# Patient Record
Sex: Female | Born: 1970
Health system: Southern US, Community
[De-identification: ages and names within clinical notes are randomized; demographics above are authoritative.]

## PROBLEM LIST (undated history)

## (undated) DIAGNOSIS — M1990 Unspecified osteoarthritis, unspecified site: Secondary | ICD-10-CM

## (undated) DIAGNOSIS — K219 Gastro-esophageal reflux disease without esophagitis: Secondary | ICD-10-CM

## (undated) DIAGNOSIS — I10 Essential (primary) hypertension: Secondary | ICD-10-CM

## (undated) DIAGNOSIS — E1169 Type 2 diabetes mellitus with other specified complication: Secondary | ICD-10-CM

## (undated) DIAGNOSIS — F419 Anxiety disorder, unspecified: Secondary | ICD-10-CM

## (undated) DIAGNOSIS — E785 Hyperlipidemia, unspecified: Secondary | ICD-10-CM

## (undated) HISTORY — DX: Gastro-esophageal reflux disease without esophagitis: K21.9

## (undated) HISTORY — PX: KNEE ARTHROSCOPY: SUR90

## (undated) HISTORY — DX: Hyperlipidemia, unspecified: E78.5

## (undated) HISTORY — PX: ABDOMINAL HYSTERECTOMY: SHX81

## (undated) HISTORY — PX: APPENDECTOMY: SHX54

## (undated) HISTORY — DX: Essential (primary) hypertension: I10

## (undated) HISTORY — PX: INCONTINENCE SURGERY: SHX676

---

## 2003-05-08 ENCOUNTER — Other Ambulatory Visit: Payer: Self-pay

## 2008-11-23 ENCOUNTER — Ambulatory Visit: Payer: Self-pay | Admitting: Internal Medicine

## 2008-11-27 ENCOUNTER — Ambulatory Visit: Payer: Self-pay | Admitting: Internal Medicine

## 2008-12-04 ENCOUNTER — Ambulatory Visit: Payer: Self-pay | Admitting: Internal Medicine

## 2009-01-31 ENCOUNTER — Ambulatory Visit: Payer: Self-pay | Admitting: Unknown Physician Specialty

## 2009-02-02 ENCOUNTER — Ambulatory Visit: Payer: Self-pay | Admitting: Unknown Physician Specialty

## 2009-10-01 ENCOUNTER — Emergency Department: Payer: Self-pay | Admitting: Emergency Medicine

## 2009-12-24 ENCOUNTER — Ambulatory Visit: Payer: Self-pay | Admitting: Internal Medicine

## 2010-11-29 IMAGING — CR DG KNEE COMPLETE 4+V*L*
1 series · 4 of 4 positions shown · non-contrast
Comparison: none

REASON FOR EXAM: Left Knee Pain
COMMENTS:

[Series 1: view not recorded · 0.17mm/px · 4 of 4 slices shown]
[im 1/4]
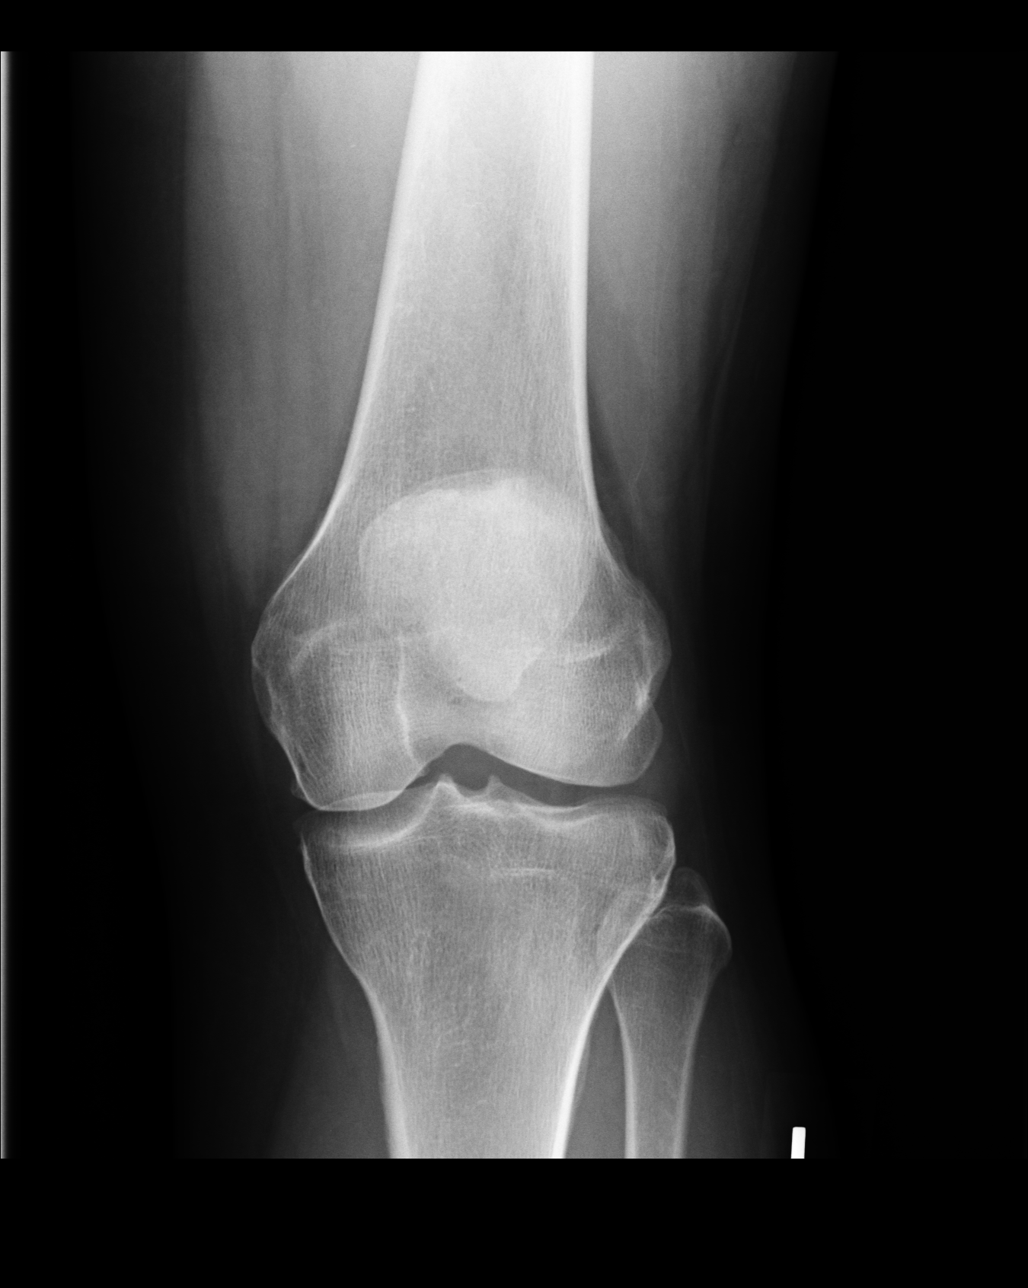
[im 2/4]
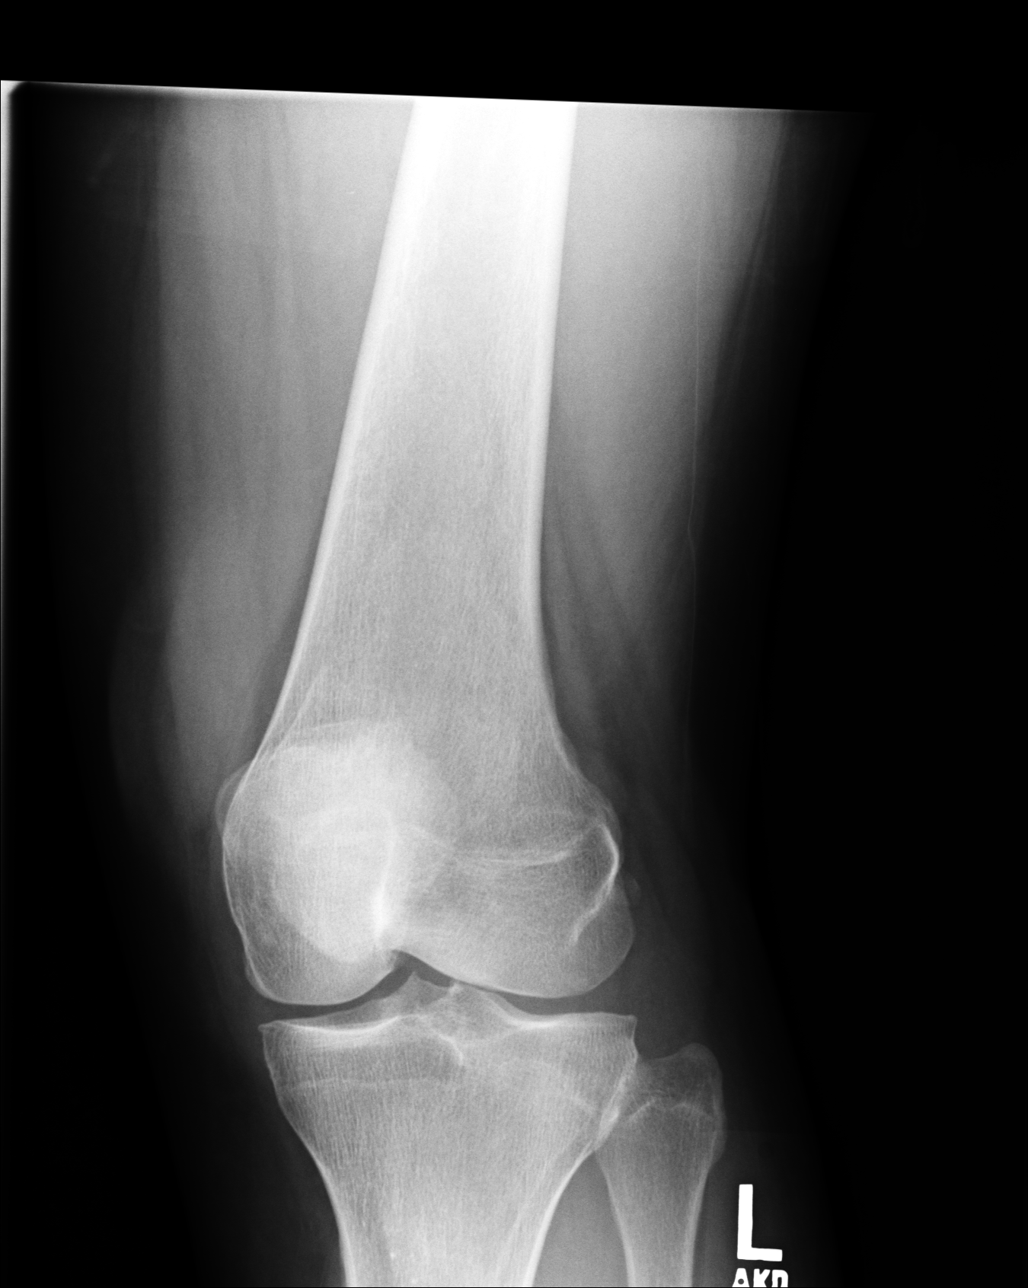
[im 3/4]
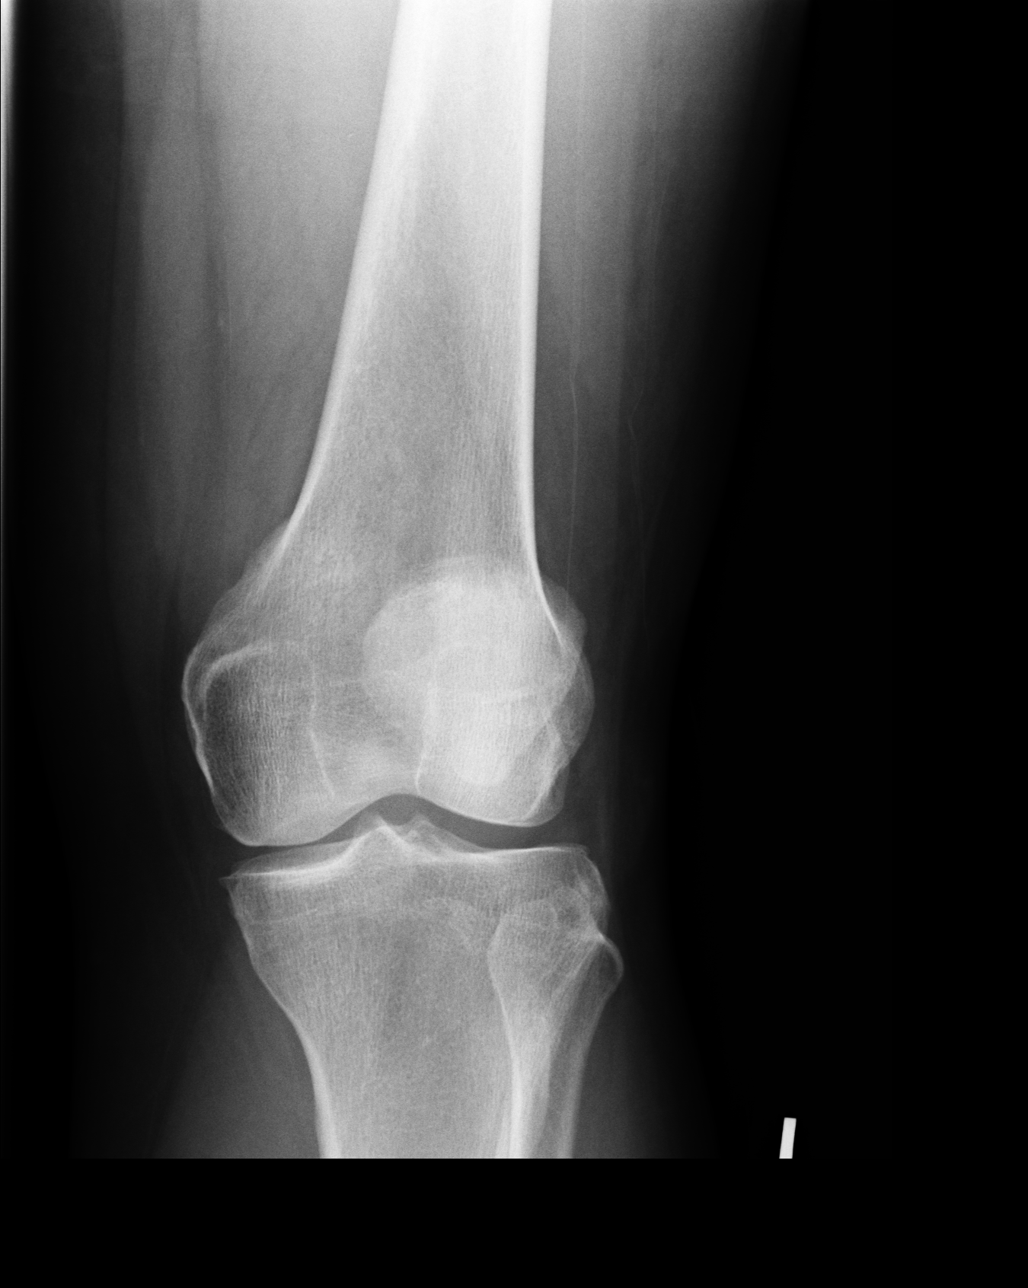
[im 4/4]
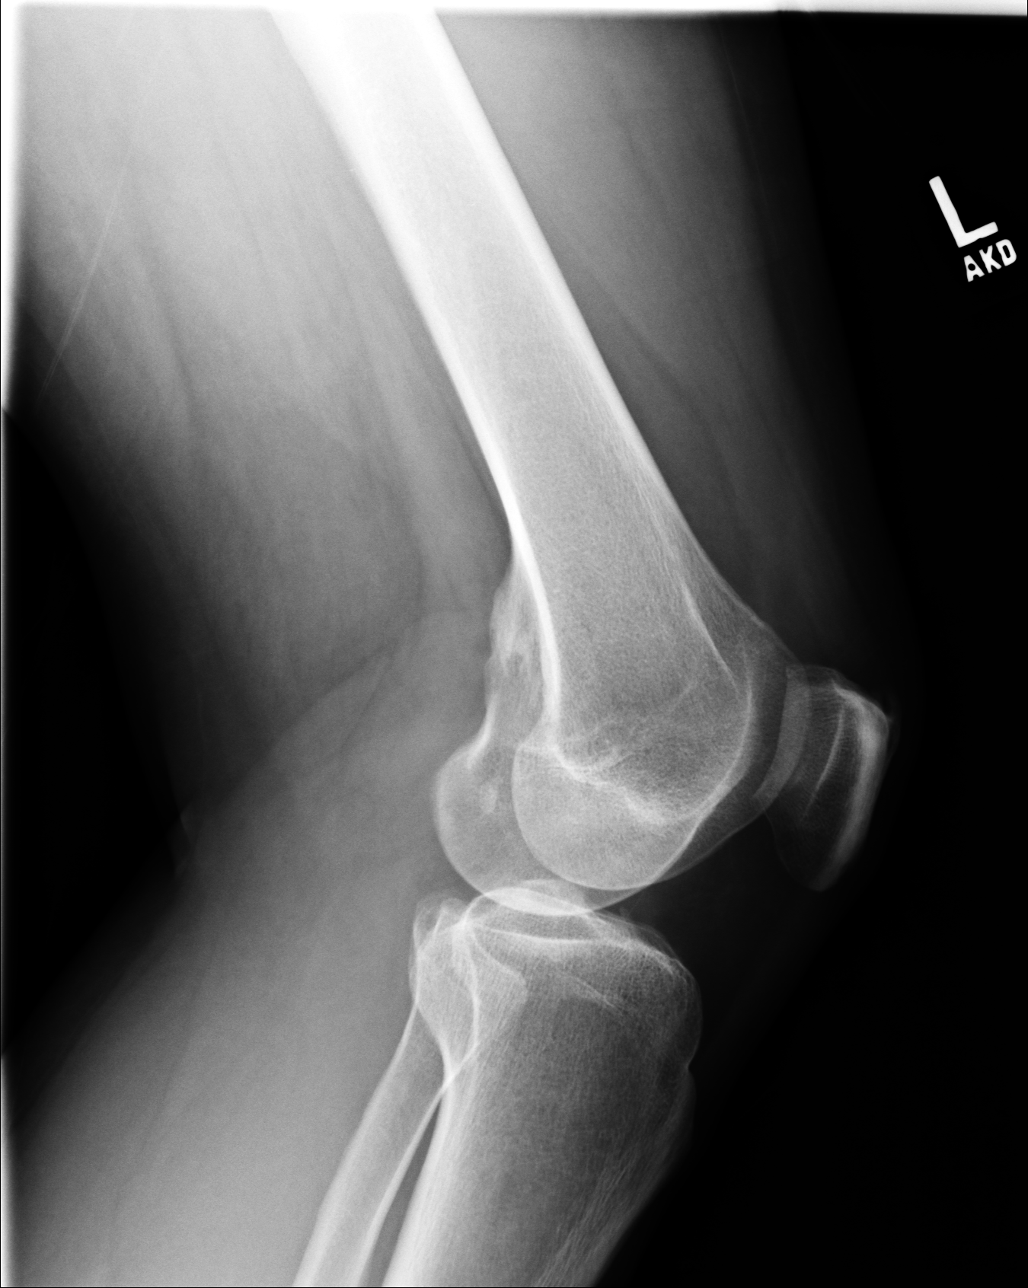

[4 of 4 positions shown; findings below may reference images not displayed]

PROCEDURE:     DXR - DXR KNEE LT COMP WITH OBLIQUES  - November 27, 2008 [DATE]

RESULT:     Four views of the left knee reveal the bones to be adequately
mineralized. There is mild beaking of the tibial spines. I do not see
evidence of an acute fracture. There is somewhat heterogeneous
mineralization of the posterior aspect of the distal femoral metaphysis.
There is subtle periosteal reaction is well.
IMPRESSION: 1. Very mild degenerative change of the knee is present manifested by
beaking of the tibial spines and tiny marginal osteophytes.
2. There is sclerotic change involving the posterior aspect of the distal
left femoral metaphysis extending to the cortical surface where there is
periosteal reaction. Further evaluation with MRI is recommended.

## 2015-04-23 ENCOUNTER — Other Ambulatory Visit: Payer: Self-pay | Admitting: Internal Medicine

## 2015-04-23 DIAGNOSIS — R1084 Generalized abdominal pain: Secondary | ICD-10-CM

## 2015-04-27 ENCOUNTER — Ambulatory Visit
Admission: RE | Admit: 2015-04-27 | Discharge: 2015-04-27 | Disposition: A | Discharge status: Home / Self Care | Payer: BLUE CROSS/BLUE SHIELD | Source: Ambulatory Visit | Attending: Internal Medicine | Admitting: Internal Medicine

## 2015-04-27 DIAGNOSIS — R1084 Generalized abdominal pain: Secondary | ICD-10-CM

## 2015-04-27 DIAGNOSIS — R197 Diarrhea, unspecified: Secondary | ICD-10-CM | POA: Diagnosis not present

## 2015-04-27 DIAGNOSIS — R109 Unspecified abdominal pain: Secondary | ICD-10-CM | POA: Insufficient documentation

## 2015-04-27 DIAGNOSIS — I700 Atherosclerosis of aorta: Secondary | ICD-10-CM | POA: Diagnosis not present

## 2015-04-27 MED ORDER — IOHEXOL 350 MG/ML SOLN
100.0000 mL | Freq: Once | INTRAVENOUS | Status: AC | PRN
  Administered 2015-04-27: 100 mL via INTRAVENOUS

## 2016-03-03 ENCOUNTER — Other Ambulatory Visit: Payer: Self-pay | Admitting: Nurse Practitioner

## 2016-03-03 DIAGNOSIS — Z1231 Encounter for screening mammogram for malignant neoplasm of breast: Secondary | ICD-10-CM

## 2016-04-03 ENCOUNTER — Other Ambulatory Visit: Payer: Self-pay | Admitting: Nurse Practitioner

## 2016-04-03 ENCOUNTER — Ambulatory Visit
Admission: RE | Admit: 2016-04-03 | Discharge: 2016-04-03 | Disposition: A | Discharge status: Home / Self Care | Payer: BLUE CROSS/BLUE SHIELD | Source: Ambulatory Visit | Attending: Nurse Practitioner | Admitting: Nurse Practitioner

## 2016-04-03 DIAGNOSIS — Z1231 Encounter for screening mammogram for malignant neoplasm of breast: Secondary | ICD-10-CM | POA: Insufficient documentation

## 2016-07-29 DIAGNOSIS — N390 Urinary tract infection, site not specified: Secondary | ICD-10-CM | POA: Diagnosis not present

## 2016-07-29 DIAGNOSIS — R1032 Left lower quadrant pain: Secondary | ICD-10-CM | POA: Diagnosis not present

## 2016-07-30 ENCOUNTER — Emergency Department: Payer: BLUE CROSS/BLUE SHIELD

## 2016-07-30 ENCOUNTER — Emergency Department
Admission: EM | Admit: 2016-07-30 | Discharge: 2016-07-30 | Disposition: A | Discharge status: Home / Self Care | Payer: BLUE CROSS/BLUE SHIELD | Attending: Emergency Medicine | Admitting: Emergency Medicine

## 2016-07-30 ENCOUNTER — Encounter: Payer: Self-pay | Admitting: Emergency Medicine

## 2016-07-30 DIAGNOSIS — K5792 Diverticulitis of intestine, part unspecified, without perforation or abscess without bleeding: Secondary | ICD-10-CM

## 2016-07-30 DIAGNOSIS — K5732 Diverticulitis of large intestine without perforation or abscess without bleeding: Secondary | ICD-10-CM | POA: Diagnosis not present

## 2016-07-30 LAB — CBC
HEMATOCRIT: 44.2 % (ref 35.0–47.0)
Hemoglobin: 15.3 g/dL (ref 12.0–16.0)
MCH: 31.2 pg (ref 26.0–34.0)
MCHC: 34.6 g/dL (ref 32.0–36.0)
MCV: 90.3 fL (ref 80.0–100.0)
Platelets: 306 10*3/uL (ref 150–440)
RBC: 4.89 MIL/uL (ref 3.80–5.20)
RDW: 13.4 % (ref 11.5–14.5)
WBC: 10.5 10*3/uL (ref 3.6–11.0)

## 2016-07-30 LAB — URINALYSIS, COMPLETE (UACMP) WITH MICROSCOPIC
BACTERIA UA: NONE SEEN
BILIRUBIN URINE: NEGATIVE
GLUCOSE, UA: NEGATIVE mg/dL
HGB URINE DIPSTICK: NEGATIVE
Ketones, ur: NEGATIVE mg/dL
LEUKOCYTES UA: NEGATIVE
NITRITE: NEGATIVE
PROTEIN: NEGATIVE mg/dL
SPECIFIC GRAVITY, URINE: 1.021 (ref 1.005–1.030)
pH: 5 (ref 5.0–8.0)

## 2016-07-30 LAB — COMPREHENSIVE METABOLIC PANEL
ALT: 20 U/L (ref 14–54)
AST: 22 U/L (ref 15–41)
Albumin: 4.2 g/dL (ref 3.5–5.0)
Alkaline Phosphatase: 83 U/L (ref 38–126)
Anion gap: 8 (ref 5–15)
BILIRUBIN TOTAL: 0.6 mg/dL (ref 0.3–1.2)
BUN: 11 mg/dL (ref 6–20)
CHLORIDE: 105 mmol/L (ref 101–111)
CO2: 25 mmol/L (ref 22–32)
Calcium: 8.9 mg/dL (ref 8.9–10.3)
Creatinine, Ser: 0.6 mg/dL (ref 0.44–1.00)
Glucose, Bld: 109 mg/dL — ABNORMAL HIGH (ref 65–99)
POTASSIUM: 3.7 mmol/L (ref 3.5–5.1)
Sodium: 138 mmol/L (ref 135–145)
TOTAL PROTEIN: 7.6 g/dL (ref 6.5–8.1)

## 2016-07-30 LAB — LIPASE, BLOOD: LIPASE: 29 U/L (ref 11–51)

## 2016-07-30 MED ORDER — ONDANSETRON 4 MG PO TBDP: 4.0000 mg | ORAL_TABLET | Freq: Three times a day (TID) | ORAL | 0 refills | Status: DC | PRN

## 2016-07-30 MED ORDER — ONDANSETRON HCL 4 MG/2ML IJ SOLN
4.0000 mg | Freq: Once | INTRAMUSCULAR | Status: AC
  Administered 2016-07-30: 4 mg via INTRAVENOUS

## 2016-07-30 MED ORDER — MORPHINE SULFATE (PF) 4 MG/ML IV SOLN
4.0000 mg | Freq: Once | INTRAVENOUS | Status: AC
  Administered 2016-07-30: 4 mg via INTRAVENOUS

## 2016-07-30 MED ORDER — ONDANSETRON HCL 4 MG/2ML IJ SOLN
INTRAMUSCULAR | Status: AC
  Administered 2016-07-30: 4 mg via INTRAVENOUS
  Filled 2016-07-30: qty 2

## 2016-07-30 MED ORDER — AMOXICILLIN-POT CLAVULANATE 875-125 MG PO TABS: 1.0000 | ORAL_TABLET | Freq: Once | ORAL | Status: DC

## 2016-07-30 MED ORDER — SODIUM CHLORIDE 0.9 % IV BOLUS (SEPSIS)
1000.0000 mL | Freq: Once | INTRAVENOUS | Status: AC
  Administered 2016-07-30: 1000 mL via INTRAVENOUS

## 2016-07-30 MED ORDER — DOCUSATE SODIUM 100 MG PO CAPS: 100.0000 mg | ORAL_CAPSULE | Freq: Two times a day (BID) | ORAL | 2 refills | Status: AC

## 2016-07-30 MED ORDER — IOPAMIDOL (ISOVUE-300) INJECTION 61%
100.0000 mL | Freq: Once | INTRAVENOUS | Status: AC | PRN
  Administered 2016-07-30: 100 mL via INTRAVENOUS

## 2016-07-30 MED ORDER — METRONIDAZOLE 500 MG PO TABS
500.0000 mg | ORAL_TABLET | Freq: Once | ORAL | Status: AC
  Administered 2016-07-30: 500 mg via ORAL
  Filled 2016-07-30: qty 1

## 2016-07-30 MED ORDER — METRONIDAZOLE 500 MG PO TABS: 500.0000 mg | ORAL_TABLET | Freq: Three times a day (TID) | ORAL | 0 refills | Status: AC

## 2016-07-30 MED ORDER — OXYCODONE HCL 5 MG PO TABS: 5.0000 mg | ORAL_TABLET | Freq: Three times a day (TID) | ORAL | 0 refills | Status: DC | PRN

## 2016-07-30 MED ORDER — MORPHINE SULFATE (PF) 4 MG/ML IV SOLN
INTRAVENOUS | Status: AC
  Administered 2016-07-30: 4 mg via INTRAVENOUS
  Filled 2016-07-30: qty 1

## 2016-07-30 NOTE — Discharge Instructions (Signed)
Pain control: Take tylenol 1000mg every 8 hours. Take 5mg of oxycodone every 6 hours for breakthrough pain. If you need the oxycodone make sure to take one senokot as well to prevent constipation.  Do not drink alcohol, drive or participate in any other potentially dangerous activities while taking this medication as it may make you sleepy. Do not take this medication with any other sedating medications, either prescription or over-the-counter.  

## 2016-07-30 NOTE — ED Triage Notes (Signed)
Pt reports LLQ pain x2 days, saw PCP yesterday and was told to here if pain continued.

## 2016-07-30 NOTE — ED Provider Notes (Signed)
[facility] Emergency Department Provider Note  ____________________________________________  Time seen: Approximately 10:12 AM  I have reviewed the triage vital signs and the nursing notes.   HISTORY  Chief Complaint Abdominal Pain   HPI Jamie Sanchez is a 46 y.o. female with a history of IBS who presents for evaluation of left lower quadrant abdominal pain. Patient reports the pain has been going on for about a week however getting progressively worse over the course of the last 2 days. She saw her PCP yesterday and was diagnosed with a urinary tract infection. She was started on Cipro and received prescription for Percocet. She reports that she was up all night taking the Percocet due to severe pain. Currently her pain is 8 out of 10, sharp stabbing pain located in the left lower quadrant, constant and nonradiating. She has been nauseated but no vomiting. Normal BM this morning. No diarrhea or constipation. No chest pain or shortness of breath. She denies dysuria or hematuria. No prior history of kidney stones and diverticulitis. Patient had a colonoscopy in 2017 that showed polyps only. Patient has had hysterectomy/salpingo-oophorectomy and appendectomy many years ago. No other abdominal surgeries.  History reviewed. No pertinent past medical history.  There are no active problems to display for this patient.   No past surgical history on file.  Prior to Admission medications   Medication Sig Start Date End Date Taking? Authorizing Provider  ALPRAZolam (XANAX) 0.25 MG tablet Take 0.25 mg by mouth 3 (three) times daily as needed for anxiety. 07/19/16  Yes [provider]  atorvastatin (LIPITOR) 10 MG tablet Take 10 mg by mouth every evening. 07/19/16  Yes [provider]  ciprofloxacin (CIPRO) 500 MG tablet Take 500 mg by mouth 2 (two) times daily. for 10 days 07/29/16 08/08/16 Yes [provider]  HYDROcodone-acetaminophen  (NORCO/VICODIN) 5-325 MG tablet Take 1 tablet by mouth every 4 (four) hours as needed for pain. 07/29/16  Yes [provider]  omeprazole (PRILOSEC) 40 MG capsule Take 40 mg by mouth daily. 07/19/16  Yes [provider]  metroNIDAZOLE (FLAGYL) 500 MG tablet Take 1 tablet (500 mg total) by mouth 3 (three) times daily. 07/30/16 08/09/16  Nita Sickle, MD  ondansetron (ZOFRAN ODT) 4 MG disintegrating tablet Take 1 tablet (4 mg total) by mouth every 8 (eight) hours as needed for nausea or vomiting. 07/30/16   Don Perking, [place], MD  oxyCODONE (ROXICODONE) 5 MG immediate release tablet Take 1 tablet (5 mg total) by mouth every 8 (eight) hours as needed. 07/30/16 07/30/17  Nita Sickle, MD    Allergies Patient has no known allergies.  No family history on file.  Social History Social History  Substance Use Topics  . Smoking status: Not on file  . Smokeless tobacco: Not on file  . Alcohol use Not on file    Review of Systems  Constitutional: Negative for fever. Eyes: Negative for visual changes. ENT: Negative for sore throat. Neck: No neck pain  Cardiovascular: Negative for chest pain. Respiratory: Negative for shortness of breath. Gastrointestinal: + LLQ abdominal pain and nausea. No vomiting or diarrhea. Genitourinary: Negative for dysuria. Musculoskeletal: Negative for back pain. Skin: Negative for rash. Neurological: Negative for headaches, weakness or numbness. Psych: No SI or HI  ____________________________________________   PHYSICAL EXAM:  VITAL SIGNS: ED Triage Vitals  Enc Vitals Group     BP 07/30/16 0956 (!) 144/86     Pulse Rate 07/30/16 0956 88     Resp 07/30/16  0956 15     Temp 07/30/16 0956 97.9 F (36.6 C)     Temp Source 07/30/16 0956 Oral     SpO2 07/30/16 0956 97 %     Weight 07/30/16 0956 198 lb (89.8 kg)     Height 07/30/16 0956 5\' 5"  (1.651 m)     Head Circumference --      Peak Flow --      Pain Score 07/30/16 0951 8      Pain Loc --      Pain Edu? --      Excl. in GC? --     Constitutional: Alert and oriented. Well appearing and in no apparent distress. HEENT:      Head: Normocephalic and atraumatic.         Eyes: Conjunctivae are normal. Sclera is non-icteric.       Mouth/Throat: Mucous membranes are moist.       Neck: Supple with no signs of meningismus. Cardiovascular: Regular rate and rhythm. No murmurs, gallops, or rubs. 2+ symmetrical distal pulses are present in all extremities. No JVD. Respiratory: Normal respiratory effort. Lungs are clear to auscultation bilaterally. No wheezes, crackles, or rhonchi.  Gastrointestinal: Soft, ttp over the LLQ with no rebound but localized guarding Musculoskeletal: Nontender with normal range of motion in all extremities. No edema, cyanosis, or erythema of extremities. Neurologic: Normal speech and language. Face is symmetric. Moving all extremities. No gross focal neurologic deficits are appreciated. Skin: Skin is warm, dry and intact. No rash noted. Psychiatric: Mood and affect are normal. Speech and behavior are normal.  ____________________________________________   LABS (all labs ordered are listed, but only abnormal results are displayed)  Labs Reviewed  COMPREHENSIVE METABOLIC PANEL - Abnormal; Notable for the following:       Result Value   Glucose, Bld 109 (*)    All other components within normal limits  URINALYSIS, COMPLETE (UACMP) WITH MICROSCOPIC - Abnormal; Notable for the following:    Color, Urine YELLOW (*)    APPearance HAZY (*)    Squamous Epithelial / LPF 0-5 (*)    All other components within normal limits  LIPASE, BLOOD  CBC   ____________________________________________  EKG  none  ____________________________________________  RADIOLOGY  CT a/p: Diverticulitis in the distal descending colon without evidence of abscess or perforation.  ____________________________________________   PROCEDURES  Procedure(s) performed:  None Procedures Critical Care performed:  None ____________________________________________   INITIAL IMPRESSION / ASSESSMENT AND PLAN / ED COURSE   46 y.o. female with a history of IBS who presents for evaluation of left lower quadrant abdominal pain x 1 week worse in the last 2 days associated with nausea. Patient is well-appearing, in no distress, has normal vital signs, she is tender to palpation on the left lower quadrant with localized guarding, no rebound. Differential diagnoses including kidney stone versus pyelonephritis versus diverticulitis. Plan for CBC, CMP, lipase, UA and CT a/p    _________________________ 1:23 PM on 07/30/2016 -----------------------------------------  CT showing uncomplicated diverticulitis. Patient has been started on Cipro by her PCP. We'll add Flagyl to her regimen. We'll discharge patient home in 1000 mg Tylenol every 8 hours, 5 mg of oxycodone every 6 hours when necessary, Zofran, and tended course of antibiotics. Discussed return precautions for any worsening abdominal pain or fever and recommended that she comes back to the emergency room if these develop. Otherwise patient is to follow-up with her primary care doctor 2-3 days.  Pertinent labs & imaging results that were available  during my care of the patient were reviewed by me and considered in my medical decision making (see chart for details).    ____________________________________________   FINAL CLINICAL IMPRESSION(S) / ED DIAGNOSES  Final diagnoses:  Diverticulitis      NEW MEDICATIONS STARTED DURING THIS VISIT:  New Prescriptions   METRONIDAZOLE (FLAGYL) 500 MG TABLET    Take 1 tablet (500 mg total) by mouth 3 (three) times daily.   ONDANSETRON (ZOFRAN ODT) 4 MG DISINTEGRATING TABLET    Take 1 tablet (4 mg total) by mouth every 8 (eight) hours as needed for nausea or vomiting.   OXYCODONE (ROXICODONE) 5 MG IMMEDIATE RELEASE TABLET    Take 1 tablet (5 mg total) by mouth every 8  (eight) hours as needed.     Note:  This document was prepared using Dragon voice recognition software and may include unintentional dictation errors.    Don PerkingVeronese, [place], MD 07/30/16 1324

## 2016-07-30 NOTE — ED Notes (Signed)
Patient transported to CT 

## 2016-11-03 DIAGNOSIS — R5383 Other fatigue: Secondary | ICD-10-CM | POA: Diagnosis not present

## 2016-11-03 DIAGNOSIS — F411 Generalized anxiety disorder: Secondary | ICD-10-CM | POA: Diagnosis not present

## 2016-11-03 DIAGNOSIS — F1390 Sedative, hypnotic, or anxiolytic use, unspecified, uncomplicated: Secondary | ICD-10-CM | POA: Diagnosis not present

## 2016-11-03 DIAGNOSIS — N390 Urinary tract infection, site not specified: Secondary | ICD-10-CM | POA: Diagnosis not present

## 2016-11-07 DIAGNOSIS — R5383 Other fatigue: Secondary | ICD-10-CM | POA: Diagnosis not present

## 2016-11-07 DIAGNOSIS — D509 Iron deficiency anemia, unspecified: Secondary | ICD-10-CM | POA: Diagnosis not present

## 2016-11-07 DIAGNOSIS — R5381 Other malaise: Secondary | ICD-10-CM | POA: Diagnosis not present

## 2016-11-07 DIAGNOSIS — E782 Mixed hyperlipidemia: Secondary | ICD-10-CM | POA: Diagnosis not present

## 2016-12-04 DIAGNOSIS — F411 Generalized anxiety disorder: Secondary | ICD-10-CM | POA: Diagnosis not present

## 2016-12-04 DIAGNOSIS — F17211 Nicotine dependence, cigarettes, in remission: Secondary | ICD-10-CM | POA: Diagnosis not present

## 2016-12-04 DIAGNOSIS — E782 Mixed hyperlipidemia: Secondary | ICD-10-CM | POA: Diagnosis not present

## 2016-12-04 DIAGNOSIS — K219 Gastro-esophageal reflux disease without esophagitis: Secondary | ICD-10-CM | POA: Diagnosis not present

## 2017-04-06 ENCOUNTER — Other Ambulatory Visit: Payer: Self-pay

## 2017-04-06 MED ORDER — VITAMIN D (ERGOCALCIFEROL) 1.25 MG (50000 UNIT) PO CAPS: 50000.0000 [IU] | ORAL_CAPSULE | ORAL | 3 refills | Status: DC

## 2017-06-04 ENCOUNTER — Ambulatory Visit: Payer: BLUE CROSS/BLUE SHIELD | Admitting: Nurse Practitioner

## 2017-06-04 ENCOUNTER — Ambulatory Visit
Admission: RE | Admit: 2017-06-04 | Discharge: 2017-06-04 | Disposition: A | Discharge status: Home / Self Care | Payer: BLUE CROSS/BLUE SHIELD | Source: Ambulatory Visit | Attending: Nurse Practitioner | Admitting: Nurse Practitioner

## 2017-06-04 ENCOUNTER — Encounter: Payer: Self-pay | Admitting: Nurse Practitioner

## 2017-06-04 VITALS — BP 132/88 | HR 78 | Resp 16 | Ht 65.0 in | Wt 217.0 lb

## 2017-06-04 DIAGNOSIS — R1030 Lower abdominal pain, unspecified: Secondary | ICD-10-CM | POA: Diagnosis not present

## 2017-06-04 DIAGNOSIS — K219 Gastro-esophageal reflux disease without esophagitis: Secondary | ICD-10-CM

## 2017-06-04 DIAGNOSIS — M25551 Pain in right hip: Secondary | ICD-10-CM

## 2017-06-04 DIAGNOSIS — Z1239 Encounter for other screening for malignant neoplasm of breast: Secondary | ICD-10-CM

## 2017-06-04 DIAGNOSIS — Z1231 Encounter for screening mammogram for malignant neoplasm of breast: Secondary | ICD-10-CM | POA: Diagnosis not present

## 2017-06-04 DIAGNOSIS — E782 Mixed hyperlipidemia: Secondary | ICD-10-CM | POA: Diagnosis not present

## 2017-06-04 DIAGNOSIS — F411 Generalized anxiety disorder: Secondary | ICD-10-CM

## 2017-06-04 DIAGNOSIS — E559 Vitamin D deficiency, unspecified: Secondary | ICD-10-CM | POA: Diagnosis not present

## 2017-06-04 MED ORDER — ATORVASTATIN CALCIUM 10 MG PO TABS: 10.0000 mg | ORAL_TABLET | Freq: Every evening | ORAL | 4 refills | Status: DC

## 2017-06-04 MED ORDER — VITAMIN D (ERGOCALCIFEROL) 1.25 MG (50000 UNIT) PO CAPS: 50000.0000 [IU] | ORAL_CAPSULE | ORAL | 2 refills | Status: DC

## 2017-06-04 MED ORDER — ALPRAZOLAM 0.25 MG PO TABS: 0.2500 mg | ORAL_TABLET | Freq: Three times a day (TID) | ORAL | 1 refills | Status: DC | PRN

## 2017-06-04 MED ORDER — OMEPRAZOLE 40 MG PO CPDR: 40.0000 mg | DELAYED_RELEASE_CAPSULE | Freq: Every day | ORAL | 4 refills | Status: DC

## 2017-06-04 NOTE — Progress Notes (Signed)
[facility] [address] [place], [place] [address]  Internal MEDICINE  Office Visit Note  Patient Name: Jamie Sanchez  [id number]  [id number]  Date of Service: 06/04/2017   Pt is here for routine follow up.    Chief Complaint  Patient presents with  . Hip Pain    on right side    The patient c/o of pain in right hip. Bothers her a lot while standing all day at work. She is having to sit down frequently which seems to be the only thing that relieves the pain. This is really interfering with her job. She must stand up a majority of the time at work. The longer she stands, the worse the pain seems to get.   Hip Pain   The incident occurred more than 1 week ago. Incident location: no injury or trauma reported.  There was no injury mechanism. The pain is present in the right hip. The quality of the pain is described as aching and cramping. The pain is at a severity of 4/10. The pain is moderate. The pain has been constant since onset. Associated symptoms include an inability to bear weight and muscle weakness. Pertinent negatives include no numbness. She reports no foreign bodies present. The symptoms are aggravated by weight bearing. She has tried heat, NSAIDs and non-weight bearing for the symptoms. The treatment provided mild relief.       Current Medication: Outpatient Encounter Medications as of 06/04/2017  Medication Sig  . ALPRAZolam (XANAX) 0.25 MG tablet Take 1 tablet (0.25 mg total) by mouth 3 (three) times daily as needed for anxiety.  Marland Kitchen atorvastatin (LIPITOR) 10 MG tablet Take 1 tablet (10 mg total) by mouth every evening.  Marland Kitchen omeprazole (PRILOSEC) 40 MG capsule Take 1 capsule (40 mg total) by mouth daily.  . Vitamin D, Ergocalciferol, (DRISDOL) 50000 units CAPS capsule Take 1 capsule (50,000 Units total) by mouth every 7 (seven) days.  . [DISCONTINUED] ALPRAZolam (XANAX) 0.25 MG tablet Take 0.25 mg by mouth 3 (three) times daily as needed for anxiety.  .  [DISCONTINUED] atorvastatin (LIPITOR) 10 MG tablet Take 10 mg by mouth every evening.  . [DISCONTINUED] omeprazole (PRILOSEC) 40 MG capsule Take 40 mg by mouth daily.  . [DISCONTINUED] Vitamin D, Ergocalciferol, (DRISDOL) 50000 units CAPS capsule Take 1 capsule (50,000 Units total) by mouth every 7 (seven) days.  Marland Kitchen docusate sodium (COLACE) 100 MG capsule Take 1 capsule (100 mg total) by mouth 2 (two) times daily. (Patient not taking: Reported on 06/04/2017)  . [DISCONTINUED] HYDROcodone-acetaminophen (NORCO/VICODIN) 5-325 MG tablet Take 1 tablet by mouth every 4 (four) hours as needed for pain.  . [DISCONTINUED] ondansetron (ZOFRAN ODT) 4 MG disintegrating tablet Take 1 tablet (4 mg total) by mouth every 8 (eight) hours as needed for nausea or vomiting. (Patient not taking: Reported on 06/04/2017)  . [DISCONTINUED] oxyCODONE (ROXICODONE) 5 MG immediate release tablet Take 1 tablet (5 mg total) by mouth every 8 (eight) hours as needed. (Patient not taking: Reported on 06/04/2017)   No facility-administered encounter medications on file as of 06/04/2017.     Surgical History: Past Surgical History:  Procedure Laterality Date  . ABDOMINAL HYSTERECTOMY    . APPENDECTOMY    . INCONTINENCE SURGERY    . KNEE ARTHROSCOPY Left     Medical History: Past Medical History:  Diagnosis Date  . GERD (gastroesophageal reflux disease)   . Hyperlipidemia   . Hypertension     Family History: Family History  Problem  Relation Age of Onset  . Heart attack Mother   . Hypertension Father   . Hypertension Sister   . Heart attack Sister   . Hypertension Brother   . Heart attack Brother     Social History   Socioeconomic History  . Marital status: Married    Spouse name: Not on file  . Number of children: Not on file  . Years of education: Not on file  . Highest education level: Not on file  Occupational History  . Not on file  Social Needs  . Financial resource strain: Not on file  . Food  insecurity:    Worry: Not on file    Inability: Not on file  . Transportation needs:    Medical: Not on file    Non-medical: Not on file  Tobacco Use  . Smoking status: Current Every Day Smoker    Packs/day: 10.00    Types: Cigarettes  . Smokeless tobacco: Never Used  Substance and Sexual Activity  . Alcohol use: Yes    Comment: social  . Drug use: Not on file  . Sexual activity: Not on file  Lifestyle  . Physical activity:    Days per week: Not on file    Minutes per session: Not on file  . Stress: Not on file  Relationships  . Social connections:    Talks on phone: Not on file    Gets together: Not on file    Attends religious service: Not on file    Active member of club or organization: Not on file    Attends meetings of clubs or organizations: Not on file    Relationship status: Not on file  . Intimate partner violence:    Fear of current or ex partner: Not on file    Emotionally abused: Not on file    Physically abused: Not on file    Forced sexual activity: Not on file  Other Topics Concern  . Not on file  Social History Narrative  . Not on file      Review of Systems  Constitutional: Negative for activity change, chills, fatigue and unexpected weight change.  HENT: Negative for congestion, postnasal drip, rhinorrhea, sneezing and sore throat.   Eyes: Negative.  Negative for redness.  Respiratory: Negative for cough, chest tightness, shortness of breath and wheezing.   Cardiovascular: Negative for chest pain and palpitations.  Gastrointestinal: Negative for abdominal pain, constipation, diarrhea, nausea and vomiting.  Endocrine: Negative for cold intolerance, heat intolerance, polydipsia, polyphagia and polyuria.  Genitourinary: Negative for dysuria and frequency.  Musculoskeletal: Positive for arthralgias. Negative for back pain, joint swelling and neck pain.       Right hip pain.   Skin: Negative for rash.  Allergic/Immunologic: Negative for  environmental allergies.  Neurological: Negative for dizziness, tremors, numbness and headaches.  Hematological: Negative for adenopathy. Does not bruise/bleed easily.  Psychiatric/Behavioral: Negative for behavioral problems (Depression), sleep disturbance and suicidal ideas. The patient is nervous/anxious.     Vital Signs: BP 132/88   Pulse 78   Resp 16   Ht 5\' 5"  (1.651 m)   Wt 217 lb (98.4 kg)   SpO2 97%   BMI 36.11 kg/m    Physical Exam  Constitutional: She is oriented to person, place, and time. She appears well-developed and well-nourished. No distress.  HENT:  Head: Normocephalic and atraumatic.  Mouth/Throat: Oropharynx is clear and moist. No oropharyngeal exudate.  Eyes: Pupils are equal, round, and reactive to light. EOM are  normal.  Neck: Normal range of motion. Neck supple. No JVD present. No tracheal deviation present. No thyromegaly present.  Cardiovascular: Normal rate, regular rhythm and normal heart sounds. Exam reveals no gallop and no friction rub.  No murmur heard. Pulmonary/Chest: Effort normal and breath sounds normal. No respiratory distress. She has no wheezes. She has no rales. She exhibits no tenderness.  Abdominal: Soft. Bowel sounds are normal. There is no tenderness.  Musculoskeletal: Normal range of motion.  Right hip tenderness. Standing up and weight gearing increases the pain. No palpable abnormalities or deformities are noted. Patient is walking with slight limp favoring the right hip.   Lymphadenopathy:    She has no cervical adenopathy.  Neurological: She is alert and oriented to person, place, and time. No cranial nerve deficit.  Skin: Skin is warm and dry. Capillary refill takes 2 to 3 seconds. She is not diaphoretic.  Psychiatric: She has a normal mood and affect. Her behavior is normal. Judgment and thought content normal.  Nursing note and vitals reviewed.  Assessment/Plan: 1. Acute pain of right hip Recommended she use NSAIDs as  needed. Use heat or ice as needed to relieve pain. Will get x-ray of right hip for further evaluation. reffer to orthopedics as indicated.  - DG HIP UNILAT WITH PELVIS 2-3 VIEWS RIGHT; Future  2. Gastroesophageal reflux disease without esophagitis May continue omeprazole daily. - omeprazole (PRILOSEC) 40 MG capsule; Take 1 capsule (40 mg total) by mouth daily.  Dispense: 90 capsule; Refill: 4  3. Mixed hyperlipidemia Continue atorvastatin 10mg  daily.  - atorvastatin (LIPITOR) 10 MG tablet; Take 1 tablet (10 mg total) by mouth every evening.  Dispense: 90 tablet; Refill: 4  4. Vitamin D deficiency Drisdol 50000iu weekly.  - Vitamin D, Ergocalciferol, (DRISDOL) 50000 units CAPS capsule; Take 1 capsule (50,000 Units total) by mouth every 7 (seven) days.  Dispense: 12 capsule; Refill: 2  5. Generalized anxiety disorder May conitnue alprazolam 0.25mg  as needed and as prescribed. New rx provided today.  - ALPRAZolam (XANAX) 0.25 MG tablet; Take 1 tablet (0.25 mg total) by mouth 3 (three) times daily as needed for anxiety.  Dispense: 90 tablet; Refill: 1  6. Screening for breast cancer - MM DIGITAL SCREENING BILATERAL; Future  General Counseling: Edwinna AreolaJudith verbalizes understanding of the findings of todays visit and agrees with plan of treatment. I have discussed any further diagnostic evaluation that may be needed or ordered today. We also reviewed her medications today. she has been encouraged to call the office with any questions or concerns that should arise related to todays visit.  This patient was seen by Vincent GrosHeather Yesennia Hirota, FNP- C in Collaboration with Dr Lyndon CodeFozia M Khan as a part of collaborative care agreement    Orders Placed This Encounter  Procedures  . DG HIP UNILAT WITH PELVIS 2-3 VIEWS RIGHT    Meds ordered this encounter  Medications  . ALPRAZolam (XANAX) 0.25 MG tablet    Sig: Take 1 tablet (0.25 mg total) by mouth 3 (three) times daily as needed for anxiety.    Dispense:  90  tablet    Refill:  1    Order Specific Question:   Supervising Provider    Answer:   Lyndon CodeKHAN, FOZIA M [[id number]]  . atorvastatin (LIPITOR) 10 MG tablet    Sig: Take 1 tablet (10 mg total) by mouth every evening.    Dispense:  90 tablet    Refill:  4    Order Specific Question:   Supervising Provider  Answer:   Lyndon Code [[id number]]  . omeprazole (PRILOSEC) 40 MG capsule    Sig: Take 1 capsule (40 mg total) by mouth daily.    Dispense:  90 capsule    Refill:  4    Order Specific Question:   Supervising Provider    Answer:   Lyndon Code [[id number]]  . Vitamin D, Ergocalciferol, (DRISDOL) 50000 units CAPS capsule    Sig: Take 1 capsule (50,000 Units total) by mouth every 7 (seven) days.    Dispense:  12 capsule    Refill:  2    Order Specific Question:   Supervising Provider    Answer:   Lyndon Code [[id number]]    Time spent:15 Minutes     Dr Lyndon Code Internal medicine

## 2017-06-08 ENCOUNTER — Telehealth: Payer: Self-pay

## 2017-06-08 NOTE — Telephone Encounter (Signed)
Results of her x-ray were normal. If she wants, I can send her to see orthopedic for further evaluation. My guess, is she has developed some bursitis in her hip.

## 2017-06-08 NOTE — Telephone Encounter (Signed)
Sent to provider 

## 2017-06-09 ENCOUNTER — Telehealth: Payer: Self-pay

## 2017-06-09 NOTE — Telephone Encounter (Signed)
Note sent to provider 

## 2017-06-10 NOTE — Telephone Encounter (Signed)
Please find out if she needs a note for her job. We had talked about this at her visit. thanks

## 2017-06-11 ENCOUNTER — Telehealth: Payer: Self-pay

## 2017-06-11 NOTE — Telephone Encounter (Signed)
Sent provider a note

## 2017-06-15 NOTE — Telephone Encounter (Signed)
Hey. Can you help me with this? She needs a note for her employer stating that, due to orthopedic coditions, she is unable to stand for longer that 1 our at a time without siting to rest. Really requires a position where she can sit more often than standing. thanks

## 2017-06-24 DIAGNOSIS — Z0001 Encounter for general adult medical examination with abnormal findings: Secondary | ICD-10-CM | POA: Insufficient documentation

## 2017-06-24 DIAGNOSIS — M25551 Pain in right hip: Secondary | ICD-10-CM | POA: Insufficient documentation

## 2017-06-24 DIAGNOSIS — E559 Vitamin D deficiency, unspecified: Secondary | ICD-10-CM | POA: Insufficient documentation

## 2017-06-24 DIAGNOSIS — E785 Hyperlipidemia, unspecified: Secondary | ICD-10-CM | POA: Insufficient documentation

## 2017-06-24 DIAGNOSIS — F411 Generalized anxiety disorder: Secondary | ICD-10-CM | POA: Insufficient documentation

## 2017-06-24 DIAGNOSIS — K219 Gastro-esophageal reflux disease without esophagitis: Secondary | ICD-10-CM | POA: Insufficient documentation

## 2017-06-24 DIAGNOSIS — E782 Mixed hyperlipidemia: Secondary | ICD-10-CM | POA: Insufficient documentation

## 2017-06-24 HISTORY — DX: Pain in right hip: M25.551

## 2017-07-27 ENCOUNTER — Telehealth: Payer: Self-pay

## 2017-07-27 NOTE — Telephone Encounter (Signed)
Patient advised letter is ready for pickup.Tat

## 2017-09-22 ENCOUNTER — Ambulatory Visit: Payer: Self-pay | Admitting: Adult Health

## 2017-09-30 ENCOUNTER — Ambulatory Visit: Payer: BLUE CROSS/BLUE SHIELD | Admitting: Nurse Practitioner

## 2017-09-30 ENCOUNTER — Encounter: Payer: Self-pay | Admitting: Nurse Practitioner

## 2017-09-30 VITALS — BP 147/97 | HR 85 | Resp 16 | Ht 65.0 in | Wt 215.4 lb

## 2017-09-30 DIAGNOSIS — K529 Noninfective gastroenteritis and colitis, unspecified: Secondary | ICD-10-CM

## 2017-09-30 DIAGNOSIS — R1084 Generalized abdominal pain: Secondary | ICD-10-CM | POA: Diagnosis not present

## 2017-09-30 DIAGNOSIS — E559 Vitamin D deficiency, unspecified: Secondary | ICD-10-CM | POA: Diagnosis not present

## 2017-09-30 DIAGNOSIS — R300 Dysuria: Secondary | ICD-10-CM

## 2017-09-30 DIAGNOSIS — E782 Mixed hyperlipidemia: Secondary | ICD-10-CM | POA: Diagnosis not present

## 2017-09-30 DIAGNOSIS — Z0001 Encounter for general adult medical examination with abnormal findings: Secondary | ICD-10-CM

## 2017-09-30 HISTORY — DX: Noninfective gastroenteritis and colitis, unspecified: K52.9

## 2017-09-30 LAB — POCT URINALYSIS DIPSTICK
BILIRUBIN UA: NEGATIVE
Blood, UA: NEGATIVE
Glucose, UA: NEGATIVE
Ketones, UA: NEGATIVE
Leukocytes, UA: NEGATIVE
Nitrite, UA: NEGATIVE
PH UA: 6.5 (ref 5.0–8.0)
PROTEIN UA: NEGATIVE
Spec Grav, UA: 1.01 (ref 1.010–1.025)
UROBILINOGEN UA: 0.2 U/dL

## 2017-09-30 MED ORDER — DICYCLOMINE HCL 10 MG PO CAPS: 10.0000 mg | ORAL_CAPSULE | Freq: Three times a day (TID) | ORAL | 2 refills | Status: DC

## 2017-09-30 NOTE — Progress Notes (Signed)
[facility] [address] [place], [place] [address]  Internal MEDICINE  Office Visit Note  Patient Name: Jamie Sanchez  [id number]  [id number]  Date of Service: 09/30/2017   Pt is here for a sick visit.  Chief Complaint  Patient presents with  . Pain    hip pain on both sides been going on for a while     Patient is having abdominal pain with cramping and diarrhea. Causing pain in lower back and both hips. Is having at least 3 very loose bowel movements per day. Cramping is significant. Had to miss work today and will be out of work the rest of the week. She is very tired. She is unable to get comfortable sitting and even laying down. Eating makes this worse. Eating vegetables is particularly bad.        Current Medication:  Outpatient Encounter Medications as of 09/30/2017  Medication Sig  . ALPRAZolam (XANAX) 0.25 MG tablet Take 1 tablet (0.25 mg total) by mouth 3 (three) times daily as needed for anxiety.  Marland Kitchen. atorvastatin (LIPITOR) 10 MG tablet Take 1 tablet (10 mg total) by mouth every evening.  Marland Kitchen. omeprazole (PRILOSEC) 40 MG capsule Take 1 capsule (40 mg total) by mouth daily.  . Vitamin D, Ergocalciferol, (DRISDOL) 50000 units CAPS capsule Take 1 capsule (50,000 Units total) by mouth every 7 (seven) days.  Marland Kitchen. dicyclomine (BENTYL) 10 MG capsule Take 1 capsule (10 mg total) by mouth 4 (four) times daily -  before meals and at bedtime.   No facility-administered encounter medications on file as of 09/30/2017.       Medical History: Past Medical History:  Diagnosis Date  . GERD (gastroesophageal reflux disease)   . Hyperlipidemia   . Hypertension      Today's Vitals   09/30/17 0937  BP: (!) 147/97  Pulse: 85  Resp: 16  SpO2: 94%  Weight: 215 lb 6.4 oz (97.7 kg)  Height: 5\' 5"  (1.651 m)     Review of Systems  Constitutional: Positive for activity change and fatigue. Negative for chills, fever and unexpected weight change.  HENT: Negative for  congestion, postnasal drip, rhinorrhea, sneezing and sore throat.   Eyes: Negative.  Negative for redness.  Respiratory: Negative for cough, chest tightness, shortness of breath and wheezing.   Cardiovascular: Negative for chest pain and palpitations.  Gastrointestinal: Positive for abdominal pain, diarrhea and rectal pain. Negative for constipation, nausea and vomiting.       Patient having 3 to 4 loose bowel movements every day  Endocrine: Negative for cold intolerance, heat intolerance, polydipsia, polyphagia and polyuria.  Genitourinary: Positive for dysuria and flank pain. Negative for frequency.  Musculoskeletal: Positive for arthralgias. Negative for back pain, joint swelling and neck pain.       Pain in both hips and in lower back.   Skin: Negative for rash.  Allergic/Immunologic: Negative for environmental allergies.  Neurological: Positive for headaches. Negative for dizziness, tremors and numbness.  Hematological: Negative for adenopathy. Does not bruise/bleed easily.  Psychiatric/Behavioral: Negative for behavioral problems (Depression), sleep disturbance and suicidal ideas. The patient is nervous/anxious.     Physical Exam  Constitutional: She is oriented to person, place, and time. She appears well-developed and well-nourished. No distress.  HENT:  Head: Normocephalic and atraumatic.  Nose: Nose normal.  Mouth/Throat: Oropharynx is clear and moist. No oropharyngeal exudate.  Eyes: Pupils are equal, round, and reactive to light. Conjunctivae and EOM are normal.  Neck: Normal range of  motion. Neck supple. No JVD present. No tracheal deviation present. No thyromegaly present.  Cardiovascular: Normal rate, regular rhythm and normal heart sounds. Exam reveals no gallop and no friction rub.  No murmur heard. Pulmonary/Chest: Effort normal and breath sounds normal. No respiratory distress. She has no wheezes. She has no rales. She exhibits no tenderness.  Abdominal: Soft. Bowel  sounds are increased. There is generalized tenderness. There is CVA tenderness. There is no rebound and no guarding.  Musculoskeletal: Normal range of motion.  Right and left hip tenderness. Standing up and weight gearing increases the pain. No palpable abnormalities or deformities are noted.   Lymphadenopathy:    She has no cervical adenopathy.  Neurological: She is alert and oriented to person, place, and time. No cranial nerve deficit.  Skin: Skin is warm and dry. Capillary refill takes 2 to 3 seconds. She is not diaphoretic.  Psychiatric: She has a normal mood and affect. Her behavior is normal. Judgment and thought content normal.  Nursing note and vitals reviewed.  Assessment/Plan: 1. Generalized abdominal pain Patient with history of colitis in the past. Will get CT abdomen/pelvis STAT. Check CMP. Refer as indicated.  - CT Abdomen Pelvis W Contrast; Future - Comprehensive metabolic panel  2. Colitis Add dicyclomine which may be taken up to TID as needed for intestinal cramps and diarrhea. CT abdomen and pelvis ordered for further evaluation.  - CT Abdomen Pelvis W Contrast; Future - dicyclomine (BENTYL) 10 MG capsule; Take 1 capsule (10 mg total) by mouth 4 (four) times daily -  before meals and at bedtime.  Dispense: 90 capsule; Refill: 2 - CBC with Differential/Platelet - Comprehensive metabolic panel  3. Mixed hyperlipidemia - CBC with Differential/Platelet - Comprehensive metabolic panel - Lipid panel  4. Vitamin D deficiency - Vitamin D 1,25 dihydroxy  5. Dysuria - POCT Urinalysis Dipstick negative for abnormalities today. Will monitor.     General Counseling: Jamie Sanchez verbalizes understanding of the findings of todays visit and agrees with plan of treatment. I have discussed any further diagnostic evaluation that may be needed or ordered today. We also reviewed her medications today. she has been encouraged to call the office with any questions or concerns that should  arise related to todays visit.    Counseling:  The 'BRAT' diet is suggested, then progress to diet as tolerated as symptoms abate. Call if bloody stools, persistent diarrhea, vomiting, fever or abdominal pain.  This patient was seen by Vincent GrosHeather Celvin Taney FNP Collaboration with Dr Lyndon CodeFozia M Khan as a part of collaborative care agreement  Orders Placed This Encounter  Procedures  . CT Abdomen Pelvis W Contrast  . CBC with Differential/Platelet  . Comprehensive metabolic panel  . T4, free  . TSH  . Lipid panel  . Vitamin D 1,25 dihydroxy  . POCT Urinalysis Dipstick    Meds ordered this encounter  Medications  . dicyclomine (BENTYL) 10 MG capsule    Sig: Take 1 capsule (10 mg total) by mouth 4 (four) times daily -  before meals and at bedtime.    Dispense:  90 capsule    Refill:  2    Order Specific Question:   Supervising Provider    Answer:   Lyndon CodeKHAN, FOZIA M [[id number]]    Time spent: 15 Minutes

## 2017-10-01 ENCOUNTER — Ambulatory Visit: Payer: BLUE CROSS/BLUE SHIELD

## 2017-10-01 ENCOUNTER — Ambulatory Visit
Admission: RE | Admit: 2017-10-01 | Discharge: 2017-10-01 | Disposition: A | Discharge status: Home / Self Care | Payer: BLUE CROSS/BLUE SHIELD | Source: Ambulatory Visit | Attending: Nurse Practitioner | Admitting: Nurse Practitioner

## 2017-10-01 DIAGNOSIS — N994 Postprocedural pelvic peritoneal adhesions: Secondary | ICD-10-CM | POA: Insufficient documentation

## 2017-10-01 DIAGNOSIS — K529 Noninfective gastroenteritis and colitis, unspecified: Secondary | ICD-10-CM | POA: Diagnosis not present

## 2017-10-01 DIAGNOSIS — I700 Atherosclerosis of aorta: Secondary | ICD-10-CM | POA: Diagnosis not present

## 2017-10-01 DIAGNOSIS — R1084 Generalized abdominal pain: Secondary | ICD-10-CM | POA: Insufficient documentation

## 2017-10-01 DIAGNOSIS — R102 Pelvic and perineal pain: Secondary | ICD-10-CM | POA: Diagnosis not present

## 2017-10-01 DIAGNOSIS — R197 Diarrhea, unspecified: Secondary | ICD-10-CM | POA: Diagnosis not present

## 2017-10-01 MED ORDER — IOPAMIDOL (ISOVUE-300) INJECTION 61%
100.0000 mL | Freq: Once | INTRAVENOUS | Status: AC | PRN
  Administered 2017-10-01: 100 mL via INTRAVENOUS

## 2017-10-02 ENCOUNTER — Telehealth: Payer: Self-pay

## 2017-10-02 DIAGNOSIS — E559 Vitamin D deficiency, unspecified: Secondary | ICD-10-CM | POA: Diagnosis not present

## 2017-10-02 DIAGNOSIS — E782 Mixed hyperlipidemia: Secondary | ICD-10-CM | POA: Diagnosis not present

## 2017-10-02 DIAGNOSIS — Z0001 Encounter for general adult medical examination with abnormal findings: Secondary | ICD-10-CM | POA: Diagnosis not present

## 2017-10-02 DIAGNOSIS — K529 Noninfective gastroenteritis and colitis, unspecified: Secondary | ICD-10-CM | POA: Diagnosis not present

## 2017-10-02 DIAGNOSIS — R1084 Generalized abdominal pain: Secondary | ICD-10-CM | POA: Diagnosis not present

## 2017-10-02 NOTE — Telephone Encounter (Signed)
-----   Message from Carlean JewsHeather E Boscia, NP sent at 10/02/2017  8:48 AM EDT ----- Please let the patient know that CT of the abdomen and pelvis looked good. Might be some mild colitis, but otherwise everything was ok. Did show moderate to severe degenerative changes of the lumbar spine which is likely causing all the pain in lower back and hips. I would like her to have consultation with orhto/spine specialist ASAP. Thanks.

## 2017-10-02 NOTE — Telephone Encounter (Signed)
PT THINKS THAT AN OVARY SHOWING IS CONCERNING. SHE SAID SHE HAD A PARTIAL HYSTERECTOMY THEN A TOTAL WITH BLADDER SLING PUT IN. NOT SURE WHAT TO TELL HER. I TOLD HER YOU WILL LOOK AT THEM AGAIN.

## 2017-10-02 NOTE — Telephone Encounter (Signed)
PT WAS NOTIFIED. 

## 2017-10-02 NOTE — Telephone Encounter (Signed)
I really can't answer that about the ovary. 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 That is radiology interpretation. Surgical slips are from the hysterectomy. The atherosclerosis, we can discuss at next visit on 10/12/2017

## 2017-10-02 NOTE — Telephone Encounter (Signed)
It is a radiologist that reads the images and sends us the reports. What I tell her is based on the report from the radiologist. IF there is a question, she should call the imaging center. Like I said, CT from 2018 states that reproductive organs were absent.

## 2017-10-02 NOTE — Telephone Encounter (Signed)
PT WAS NOTIFIED. ASKED PT IF SHE WOULD LIKE TO COME IN EARLIER TO DISCUSS WITH HEATHER IN DETAIL AND SHE DID NOT WANT TO.

## 2017-10-05 ENCOUNTER — Encounter: Payer: Self-pay | Admitting: Nurse Practitioner

## 2017-10-06 ENCOUNTER — Telehealth: Payer: Self-pay

## 2017-10-06 LAB — COMPREHENSIVE METABOLIC PANEL
ALBUMIN: 4.5 g/dL (ref 3.5–5.5)
ALK PHOS: 94 IU/L (ref 39–117)
ALT: 20 IU/L (ref 0–32)
AST: 19 IU/L (ref 0–40)
Albumin/Globulin Ratio: 1.9 (ref 1.2–2.2)
BILIRUBIN TOTAL: 0.5 mg/dL (ref 0.0–1.2)
BUN / CREAT RATIO: 14 (ref 9–23)
BUN: 10 mg/dL (ref 6–24)
CHLORIDE: 103 mmol/L (ref 96–106)
CO2: 23 mmol/L (ref 20–29)
CREATININE: 0.7 mg/dL (ref 0.57–1.00)
Calcium: 9.5 mg/dL (ref 8.7–10.2)
GFR calc Af Amer: 118 mL/min/{1.73_m2} (ref >59)
GFR calc non Af Amer: 102 mL/min/{1.73_m2} (ref >59)
GLUCOSE: 125 mg/dL — AB (ref 65–99)
Globulin, Total: 2.4 g/dL (ref 1.5–4.5)
Potassium: 4.7 mmol/L (ref 3.5–5.2)
SODIUM: 141 mmol/L (ref 134–144)
Total Protein: 6.9 g/dL (ref 6.0–8.5)

## 2017-10-06 LAB — CBC WITH DIFFERENTIAL/PLATELET
BASOS ABS: 0.1 10*3/uL (ref 0.0–0.2)
Basos: 1 %
EOS (ABSOLUTE): 0.2 10*3/uL (ref 0.0–0.4)
Eos: 2 %
HEMOGLOBIN: 15 g/dL (ref 11.1–15.9)
Hematocrit: 44.5 % (ref 34.0–46.6)
Immature Grans (Abs): 0 10*3/uL (ref 0.0–0.1)
Immature Granulocytes: 0 %
LYMPHS ABS: 2.7 10*3/uL (ref 0.7–3.1)
Lymphs: 30 %
MCH: 31.8 pg (ref 26.6–33.0)
MCHC: 33.7 g/dL (ref 31.5–35.7)
MCV: 94 fL (ref 79–97)
MONOS ABS: 0.7 10*3/uL (ref 0.1–0.9)
Monocytes: 8 %
NEUTROS ABS: 5.5 10*3/uL (ref 1.4–7.0)
Neutrophils: 59 %
Platelets: 326 10*3/uL (ref 150–450)
RBC: 4.72 x10E6/uL (ref 3.77–5.28)
RDW: 13.3 % (ref 12.3–15.4)
WBC: 9.1 10*3/uL (ref 3.4–10.8)

## 2017-10-06 LAB — LIPID PANEL
CHOL/HDL RATIO: 4.1 ratio (ref 0.0–4.4)
Cholesterol, Total: 152 mg/dL (ref 100–199)
HDL: 37 mg/dL — ABNORMAL LOW (ref >39)
LDL Calculated: 81 mg/dL (ref 0–99)
Triglycerides: 172 mg/dL — ABNORMAL HIGH (ref 0–149)
VLDL Cholesterol Cal: 34 mg/dL (ref 5–40)

## 2017-10-06 LAB — VITAMIN D 1,25 DIHYDROXY
VITAMIN D2 1, 25 (OH): 44 pg/mL
VITAMIN D3 1, 25 (OH): 11 pg/mL
Vitamin D 1, 25 (OH)2 Total: 55 pg/mL

## 2017-10-06 LAB — TSH: TSH: 1.93 u[IU]/mL (ref 0.450–4.500)

## 2017-10-06 LAB — T4, FREE: FREE T4: 0.84 ng/dL (ref 0.82–1.77)

## 2017-10-06 NOTE — Telephone Encounter (Signed)
Called and spoke with patient to let her know that we got [facility] for [facility]/23 at 1pm with Ivar DrapeAmanda Ferri. Patient informed us that she does not want to keep that appointment because she would rather get an ultrasound first. I informed the patient that this was ordered during her most recent appointment to further address the pain she is having, but she insisted that it was more important to speak with her PCP about scheduling an ultrasound first. She said she was going to call and cancel her appointment with Ivar DrapeAmanda Ferri.

## 2017-10-06 NOTE — Progress Notes (Signed)
Patient has been advised and scheduled appt [facility]

## 2017-10-07 NOTE — Telephone Encounter (Signed)
Can pt go to ED stat if she is in pain

## 2017-10-12 ENCOUNTER — Encounter: Payer: Self-pay | Admitting: Nurse Practitioner

## 2017-11-03 ENCOUNTER — Other Ambulatory Visit: Payer: Self-pay | Admitting: Nurse Practitioner

## 2017-11-03 ENCOUNTER — Telehealth: Payer: Self-pay | Admitting: Nurse Practitioner

## 2017-11-03 DIAGNOSIS — F411 Generalized anxiety disorder: Secondary | ICD-10-CM

## 2017-11-03 MED ORDER — ALPRAZOLAM 0.25 MG PO TABS: 0.2500 mg | ORAL_TABLET | Freq: Three times a day (TID) | ORAL | 1 refills | Status: DC | PRN

## 2017-11-03 NOTE — Progress Notes (Signed)
Renewed rx for alprazolam 0.25mg up to three times daily. Sent with 1 refill. Last rx written 05/2017.

## 2017-11-03 NOTE — Telephone Encounter (Signed)
Prescription refill request for Alprazolam 0.25 ,  Last office visit was 09/30/17 , no follow up appointment scheduled at this time

## 2017-11-03 NOTE — Telephone Encounter (Signed)
Renewed rx for alprazolam 0.25mg  up to three times daily. Sent with 1 refill. Last rx written 05/2017.

## 2018-04-07 ENCOUNTER — Telehealth: Payer: Self-pay

## 2018-04-07 ENCOUNTER — Other Ambulatory Visit: Payer: Self-pay | Admitting: Nurse Practitioner

## 2018-04-07 DIAGNOSIS — F411 Generalized anxiety disorder: Secondary | ICD-10-CM

## 2018-04-07 MED ORDER — ALPRAZOLAM 0.25 MG PO TABS: 0.2500 mg | ORAL_TABLET | Freq: Three times a day (TID) | ORAL | 1 refills | Status: DC | PRN

## 2018-04-07 NOTE — Progress Notes (Signed)
Renewed her alprazolam 0.25mg  tablets. Sent new prescription to her pharmacy. She is very low risk patient and, Im positive, will follow up when situation settles down.

## 2018-04-07 NOTE — Telephone Encounter (Signed)
Renewed her alprazolam 0.25mg tablets. Sent new prescription to her pharmacy. She is very low risk patient and, Im positive, will follow up when situation settles down.  

## 2018-04-09 NOTE — Telephone Encounter (Signed)
PT WAS NOTIFIED. 

## 2018-07-22 ENCOUNTER — Encounter: Payer: Self-pay | Admitting: Nurse Practitioner

## 2018-07-22 ENCOUNTER — Other Ambulatory Visit: Payer: Self-pay

## 2018-07-22 ENCOUNTER — Ambulatory Visit: Payer: BC Managed Care – PPO | Admitting: Nurse Practitioner

## 2018-07-22 VITALS — Ht 65.0 in | Wt 197.0 lb

## 2018-07-22 DIAGNOSIS — K219 Gastro-esophageal reflux disease without esophagitis: Secondary | ICD-10-CM

## 2018-07-22 DIAGNOSIS — E782 Mixed hyperlipidemia: Secondary | ICD-10-CM | POA: Diagnosis not present

## 2018-07-22 DIAGNOSIS — F411 Generalized anxiety disorder: Secondary | ICD-10-CM | POA: Diagnosis not present

## 2018-07-22 MED ORDER — ATORVASTATIN CALCIUM 10 MG PO TABS: 10.0000 mg | ORAL_TABLET | Freq: Every evening | ORAL | 3 refills | Status: DC

## 2018-07-22 MED ORDER — OMEPRAZOLE 40 MG PO CPDR: 40.0000 mg | DELAYED_RELEASE_CAPSULE | Freq: Every day | ORAL | 3 refills | Status: DC

## 2018-07-22 MED ORDER — ALPRAZOLAM 0.25 MG PO TABS: 0.2500 mg | ORAL_TABLET | Freq: Three times a day (TID) | ORAL | 2 refills | Status: DC | PRN

## 2018-07-22 NOTE — Progress Notes (Signed)
[facility] [address] [place], [place] [address]  Internal MEDICINE  Telephone Visit  Patient Name: Jamie Sanchez  [id number]  [id number]  Date of Service: 07/22/2018  I connected with the patient at 12:09pm by webcam and verified the patients identity using two identifiers.   I discussed the limitations, risks, security and privacy concerns of performing an evaluation and management service by webcam and the availability of in person appointments. I also discussed with the patient that there may be a patient responsible charge related to the service.  The patient expressed understanding and agrees to proceed.    Chief Complaint  Patient presents with  . Telephone Assessment  . Telephone Screen  . Hyperlipidemia    The patient has been contacted via webcam for follow up visit due to concerns for spread of novel coronavirus. The patient states that she is doing well. She is currently furloughed from work. Current plan is to return to work 07/02/2018. She she has no concerns or complaints. She is due for routine labs, CPE, and screening mammogram.       Current Medication: Outpatient Encounter Medications as of 07/22/2018  Medication Sig  . ALPRAZolam (XANAX) 0.25 MG tablet Take 1 tablet (0.25 mg total) by mouth 3 (three) times daily as needed for anxiety.  Marland Kitchen atorvastatin (LIPITOR) 10 MG tablet Take 1 tablet (10 mg total) by mouth every evening.  . dicyclomine (BENTYL) 10 MG capsule Take 1 capsule (10 mg total) by mouth 4 (four) times daily -  before meals and at bedtime.  Marland Kitchen omeprazole (PRILOSEC) 40 MG capsule Take 1 capsule (40 mg total) by mouth daily.  . Vitamin D, Ergocalciferol, (DRISDOL) 50000 units CAPS capsule Take 1 capsule (50,000 Units total) by mouth every 7 (seven) days.  . [DISCONTINUED] ALPRAZolam (XANAX) 0.25 MG tablet Take 1 tablet (0.25 mg total) by mouth 3 (three) times daily as needed for anxiety.  . [DISCONTINUED] atorvastatin (LIPITOR) 10 MG tablet  Take 1 tablet (10 mg total) by mouth every evening.  . [DISCONTINUED] omeprazole (PRILOSEC) 40 MG capsule Take 1 capsule (40 mg total) by mouth daily.   No facility-administered encounter medications on file as of 07/22/2018.     Surgical History: Past Surgical History:  Procedure Laterality Date  . ABDOMINAL HYSTERECTOMY    . APPENDECTOMY    . INCONTINENCE SURGERY    . KNEE ARTHROSCOPY Left     Medical History: Past Medical History:  Diagnosis Date  . GERD (gastroesophageal reflux disease)   . Hyperlipidemia     Family History: Family History  Problem Relation Age of Onset  . Heart attack Mother   . Hypertension Father   . Hypertension Sister   . Heart attack Sister   . Hypertension Brother   . Heart attack Brother     Social History   Socioeconomic History  . Marital status: Married    Spouse name: Not on file  . Number of children: Not on file  . Years of education: Not on file  . Highest education level: Not on file  Occupational History  . Not on file  Social Needs  . Financial resource strain: Not on file  . Food insecurity:    Worry: Not on file    Inability: Not on file  . Transportation needs:    Medical: Not on file    Non-medical: Not on file  Tobacco Use  . Smoking status: Current Every Day Smoker    Packs/day: 10.00  Types: Cigarettes  . Smokeless tobacco: Never Used  Substance and Sexual Activity  . Alcohol use: Yes    Comment: social  . Drug use: Not on file  . Sexual activity: Not on file  Lifestyle  . Physical activity:    Days per week: Not on file    Minutes per session: Not on file  . Stress: Not on file  Relationships  . Social connections:    Talks on phone: Not on file    Gets together: Not on file    Attends religious service: Not on file    Active member of club or organization: Not on file    Attends meetings of clubs or organizations: Not on file    Relationship status: Not on file  . Intimate partner violence:     Fear of current or ex partner: Not on file    Emotionally abused: Not on file    Physically abused: Not on file    Forced sexual activity: Not on file  Other Topics Concern  . Not on file  Social History Narrative  . Not on file      Review of Systems  Constitutional: Negative for chills, fatigue and unexpected weight change.  HENT: Negative for congestion, postnasal drip, rhinorrhea, sneezing and sore throat.   Respiratory: Negative for cough, chest tightness and shortness of breath.   Cardiovascular: Negative for chest pain and palpitations.  Gastrointestinal: Negative for abdominal pain, constipation, diarrhea, nausea and vomiting.  Endocrine: Negative for cold intolerance, heat intolerance, polydipsia and polyuria.  Musculoskeletal: Negative for arthralgias, back pain, joint swelling and neck pain.  Skin: Negative for rash.  Neurological: Negative for dizziness, tremors, numbness and headaches.  Hematological: Negative for adenopathy. Does not bruise/bleed easily.  Psychiatric/Behavioral: Positive for dysphoric mood. Negative for behavioral problems (Depression), sleep disturbance and suicidal ideas. The patient is nervous/anxious.     Today's Vitals   07/22/18 1123  Weight: 197 lb (89.4 kg)  Height: 5\' 5"  (1.651 m)   Body mass index is 32.78 kg/m.  Observation/Objective:    The patient is alert and oriented. She is pleasant and answers all questions appropriately. Breathing is non-labored. She is in no acute distress at this time.    Assessment/Plan: 1. Mixed hyperlipidemia Continue atorvastatin. Will check fasting lipid panel after CPE and adjust as indicated  - atorvastatin (LIPITOR) 10 MG tablet; Take 1 tablet (10 mg total) by mouth every evening.  Dispense: 90 tablet; Refill: 3  2. Gastroesophageal reflux disease without esophagitis - omeprazole (PRILOSEC) 40 MG capsule; Take 1 capsule (40 mg total) by mouth daily.  Dispense: 90 capsule; Refill: 3  3.  Generalized anxiety disorder May continue to take alprazolam 0.25mg  up to three times daily if needed for acute anxiety. New prescription sent to her pharmacy.  - ALPRAZolam (XANAX) 0.25 MG tablet; Take 1 tablet (0.25 mg total) by mouth 3 (three) times daily as needed for anxiety.  Dispense: 90 tablet; Refill: 2  General Counseling: Edwinna AreolaJudith verbalizes understanding of the findings of today's phone visit and agrees with plan of treatment. I have discussed any further diagnostic evaluation that may be needed or ordered today. We also reviewed her medications today. she has been encouraged to call the office with any questions or concerns that should arise related to todays visit.  This patient was seen by Vincent GrosHeather Zakariah Urwin FNP Collaboration with Dr Lyndon CodeFozia M Khan as a part of collaborative care agreement  Meds ordered this encounter  Medications  . ALPRAZolam (  XANAX) 0.25 MG tablet    Sig: Take 1 tablet (0.25 mg total) by mouth 3 (three) times daily as needed for anxiety.    Dispense:  90 tablet    Refill:  2    Order Specific Question:   Supervising Provider    Answer:   Lyndon Code [[id number]]  . atorvastatin (LIPITOR) 10 MG tablet    Sig: Take 1 tablet (10 mg total) by mouth every evening.    Dispense:  90 tablet    Refill:  3    Order Specific Question:   Supervising Provider    Answer:   Lyndon Code [[id number]]  . omeprazole (PRILOSEC) 40 MG capsule    Sig: Take 1 capsule (40 mg total) by mouth daily.    Dispense:  90 capsule    Refill:  3    Order Specific Question:   Supervising Provider    Answer:   Lyndon Code [[id number]]    Time spent: 56 Minutes    Dr Lyndon Code Internal medicine

## 2018-08-18 ENCOUNTER — Other Ambulatory Visit: Payer: Self-pay

## 2018-08-18 ENCOUNTER — Ambulatory Visit: Payer: BC Managed Care – PPO | Admitting: Nurse Practitioner

## 2018-08-18 ENCOUNTER — Encounter: Payer: Self-pay | Admitting: Nurse Practitioner

## 2018-08-18 ENCOUNTER — Telehealth: Payer: Self-pay | Admitting: *Deleted

## 2018-08-18 VITALS — Temp 99.3°F | Ht 65.0 in | Wt 195.0 lb

## 2018-08-18 DIAGNOSIS — B373 Candidiasis of vulva and vagina: Secondary | ICD-10-CM | POA: Diagnosis not present

## 2018-08-18 DIAGNOSIS — J209 Acute bronchitis, unspecified: Secondary | ICD-10-CM | POA: Insufficient documentation

## 2018-08-18 DIAGNOSIS — Z20822 Contact with and (suspected) exposure to covid-19: Secondary | ICD-10-CM

## 2018-08-18 DIAGNOSIS — B3731 Acute candidiasis of vulva and vagina: Secondary | ICD-10-CM

## 2018-08-18 DIAGNOSIS — Z20828 Contact with and (suspected) exposure to other viral communicable diseases: Secondary | ICD-10-CM | POA: Diagnosis not present

## 2018-08-18 HISTORY — DX: Acute candidiasis of vulva and vagina: B37.31

## 2018-08-18 HISTORY — DX: Acute bronchitis, unspecified: J20.9

## 2018-08-18 MED ORDER — FLUCONAZOLE 150 MG PO TABS: ORAL_TABLET | ORAL | 0 refills | Status: DC

## 2018-08-18 MED ORDER — AZITHROMYCIN 250 MG PO TABS: ORAL_TABLET | ORAL | 0 refills | Status: DC

## 2018-08-18 NOTE — Telephone Encounter (Signed)
-----   Message from Corlis Hove sent at 08/18/2018 12:09 PM EDT ----- Please call pt to schedule covid test

## 2018-08-18 NOTE — Telephone Encounter (Signed)
Pt scheduled for covid testing today @ [facility] @ 1:45. Instructions given and order placed  

## 2018-08-18 NOTE — Progress Notes (Signed)
[facility] [address] [place], [place] [address]  Internal MEDICINE  Telephone Visit  Patient Name: Jamie Sanchez  [id number]  [id number]  Date of Service: 08/18/2018  I connected with the patient at 11:44am by webcam and verified the patients identity using two identifiers.   I discussed the limitations, risks, security and privacy concerns of performing an evaluation and management service by webcam and the availability of in person appointments. I also discussed with the patient that there may be a patient responsible charge related to the service.  The patient expressed understanding and agrees to proceed.    Chief Complaint  Patient presents with  . Fever    going on for few days   . Headache  . Cough  . Medical Management of Chronic Issues    chest pressure     The patient has been contacted via webcam for follow up visit due to concerns for spread of novel coronavirus. The patient states that she has pressure in her chest heaviness and cough. Has bad headache and running a low grade fever. Symptoms have been going on for past three or four days. She states that her sister has similar symptoms and has had testing done for COVID 19 but test results are not back at this time.       Current Medication: Outpatient Encounter Medications as of 08/18/2018  Medication Sig  . ALPRAZolam (XANAX) 0.25 MG tablet Take 1 tablet (0.25 mg total) by mouth 3 (three) times daily as needed for anxiety.  Marland Kitchen. atorvastatin (LIPITOR) 10 MG tablet Take 1 tablet (10 mg total) by mouth every evening.  . dicyclomine (BENTYL) 10 MG capsule Take 1 capsule (10 mg total) by mouth 4 (four) times daily -  before meals and at bedtime.  Marland Kitchen. omeprazole (PRILOSEC) 40 MG capsule Take 1 capsule (40 mg total) by mouth daily.  . Vitamin D, Ergocalciferol, (DRISDOL) 50000 units CAPS capsule Take 1 capsule (50,000 Units total) by mouth every 7 (seven) days.  Marland Kitchen. azithromycin (ZITHROMAX) 250 MG tablet z-pack -  take as directed for 5 days for acute bronchitis  . fluconazole (DIFLUCAN) 150 MG tablet Take 1 tablet po once. May repeat dose in 3 days as needed for persistent symptoms.   No facility-administered encounter medications on file as of 08/18/2018.     Surgical History: Past Surgical History:  Procedure Laterality Date  . ABDOMINAL HYSTERECTOMY    . APPENDECTOMY    . INCONTINENCE SURGERY    . KNEE ARTHROSCOPY Left     Medical History: Past Medical History:  Diagnosis Date  . GERD (gastroesophageal reflux disease)   . Hyperlipidemia     Family History: Family History  Problem Relation Age of Onset  . Heart attack Mother   . Hypertension Father   . Hypertension Sister   . Heart attack Sister   . Hypertension Brother   . Heart attack Brother     Social History   Socioeconomic History  . Marital status: Married    Spouse name: Not on file  . Number of children: Not on file  . Years of education: Not on file  . Highest education level: Not on file  Occupational History  . Not on file  Social Needs  . Financial resource strain: Not on file  . Food insecurity    Worry: Not on file    Inability: Not on file  . Transportation needs    Medical: Not on file    Non-medical: Not  on file  Tobacco Use  . Smoking status: Current Every Day Smoker    Packs/day: 10.00    Types: Cigarettes  . Smokeless tobacco: Never Used  Substance and Sexual Activity  . Alcohol use: Yes    Comment: social  . Drug use: Not on file  . Sexual activity: Not on file  Lifestyle  . Physical activity    Days per week: Not on file    Minutes per session: Not on file  . Stress: Not on file  Relationships  . Social Musicianconnections    Talks on phone: Not on file    Gets together: Not on file    Attends religious service: Not on file    Active member of club or organization: Not on file    Attends meetings of clubs or organizations: Not on file    Relationship status: Not on file  . Intimate  partner violence    Fear of current or ex partner: Not on file    Emotionally abused: Not on file    Physically abused: Not on file    Forced sexual activity: Not on file  Other Topics Concern  . Not on file  Social History Narrative  . Not on file      Review of Systems  Constitutional: Positive for chills, fatigue and fever.  HENT: Positive for congestion, ear pain, postnasal drip, rhinorrhea, sinus pain, sore throat and voice change.   Respiratory: Positive for cough and wheezing.   Cardiovascular: Negative for palpitations.       Chest discomfort.   Gastrointestinal: Positive for nausea.  Musculoskeletal: Positive for arthralgias and myalgias.  Allergic/Immunologic: Negative.   Neurological: Positive for headaches.   Today's Vitals   08/18/18 1141  Temp: 99.3 F (37.4 C)  Weight: 195 lb (88.5 kg)  Height: 5\' 5"  (1.651 m)   Body mass index is 32.45 kg/m.  Observation/Objective:   The patient is alert and oriented. She is pleasant and answers all questions appropriately. Breathing is non-labored. She is in no acute distress at this time. She is nasally congested. She has dry cough and appears to feel poorly.    Assessment/Plan: 1. Acute bronchitis, unspecified organism Start z-pack. Take as directedfor 5 days. Rest and increase fluids. Recommend OTC tylenol to help reduce fever and pain. - azithromycin (ZITHROMAX) 250 MG tablet; z-pack - take as directed for 5 days for acute bronchitis  Dispense: 6 tablet; Refill: 0  2. Vaginal candida Diflucan ordered in case yeast infection develops. Take as needed and as prescribed.  - fluconazole (DIFLUCAN) 150 MG tablet; Take 1 tablet po once. May repeat dose in 3 days as needed for persistent symptoms.  Dispense: 3 tablet; Refill: 0  3. Close Exposure to Covid-19 Virus Will send for COVID 19 testing.  self-quarantine for 14 days and an additional 3 days after respiratory symptoms resolve. Everyone in his home should be  vigilant about social distancing, and may benefit from self-quarantining for next 14 days. Take tylenol as needed for fever and headache. Contact PCP or ER if symptoms get worse or do not get better over next several days.   General Counseling: Jamie Sanchez verbalizes understanding of the findings of today's phone visit and agrees with plan of treatment. I have discussed any further diagnostic evaluation that may be needed or ordered today. We also reviewed her medications today. she has been encouraged to call the office with any questions or concerns that should arise related to todays visit.  Rest and  increase fluids. Continue using OTC medication to control symptoms.   This patient was seen by [place] with Dr Lavera Guise as a part of collaborative care agreement  Meds ordered this encounter  Medications  . azithromycin (ZITHROMAX) 250 MG tablet    Sig: z-pack - take as directed for 5 days for acute bronchitis    Dispense:  6 tablet    Refill:  0    Order Specific Question:   Supervising Provider    Answer:   Lavera Guise [[id number]]  . fluconazole (DIFLUCAN) 150 MG tablet    Sig: Take 1 tablet po once. May repeat dose in 3 days as needed for persistent symptoms.    Dispense:  3 tablet    Refill:  0    Order Specific Question:   Supervising Provider    Answer:   Lavera Guise [[id number]]    Time spent: 3 Minutes    Dr Lavera Guise Internal medicine

## 2018-08-19 ENCOUNTER — Emergency Department: Payer: Self-pay

## 2018-08-19 ENCOUNTER — Encounter: Payer: Self-pay | Admitting: Emergency Medicine

## 2018-08-19 ENCOUNTER — Emergency Department
Admission: EM | Admit: 2018-08-19 | Discharge: 2018-08-19 | Disposition: A | Discharge status: Home / Self Care | Payer: Self-pay | Attending: Emergency Medicine | Admitting: Emergency Medicine

## 2018-08-19 ENCOUNTER — Other Ambulatory Visit: Payer: Self-pay

## 2018-08-19 DIAGNOSIS — R0789 Other chest pain: Secondary | ICD-10-CM | POA: Insufficient documentation

## 2018-08-19 DIAGNOSIS — Z79899 Other long term (current) drug therapy: Secondary | ICD-10-CM | POA: Insufficient documentation

## 2018-08-19 DIAGNOSIS — F17210 Nicotine dependence, cigarettes, uncomplicated: Secondary | ICD-10-CM | POA: Insufficient documentation

## 2018-08-19 LAB — BASIC METABOLIC PANEL
Anion gap: 9 (ref 5–15)
BUN: 12 mg/dL (ref 6–20)
CO2: 25 mmol/L (ref 22–32)
Calcium: 9.1 mg/dL (ref 8.9–10.3)
Chloride: 105 mmol/L (ref 98–111)
Creatinine, Ser: 0.71 mg/dL (ref 0.44–1.00)
GFR calc Af Amer: >60 mL/min (ref >60)
GFR calc non Af Amer: >60 mL/min (ref >60)
Glucose, Bld: 102 mg/dL — ABNORMAL HIGH (ref 70–99)
Potassium: 4.5 mmol/L (ref 3.5–5.1)
Sodium: 139 mmol/L (ref 135–145)

## 2018-08-19 LAB — FIBRIN DERIVATIVES D-DIMER (ARMC ONLY): Fibrin derivatives D-dimer (ARMC): 252.41 ng/mL (FEU) (ref 0.00–499.00)

## 2018-08-19 LAB — CBC
HCT: 43.4 % (ref 36.0–46.0)
Hemoglobin: 14.7 g/dL (ref 12.0–15.0)
MCH: 31.7 pg (ref 26.0–34.0)
MCHC: 33.9 g/dL (ref 30.0–36.0)
MCV: 93.7 fL (ref 80.0–100.0)
Platelets: 280 10*3/uL (ref 150–400)
RBC: 4.63 MIL/uL (ref 3.87–5.11)
RDW: 12.3 % (ref 11.5–15.5)
WBC: 9.1 10*3/uL (ref 4.0–10.5)
nRBC: 0 % (ref 0.0–0.2)

## 2018-08-19 LAB — TROPONIN I (HIGH SENSITIVITY): Troponin I (High Sensitivity): <2 ng/L (ref <18)

## 2018-08-19 MED ORDER — ALUM & MAG HYDROXIDE-SIMETH 200-200-20 MG/5ML PO SUSP
30.0000 mL | Freq: Once | ORAL | Status: AC
  Administered 2018-08-19: 30 mL via ORAL
  Filled 2018-08-19: qty 30

## 2018-08-19 MED ORDER — LIDOCAINE VISCOUS HCL 2 % MT SOLN
15.0000 mL | Freq: Once | OROMUCOSAL | Status: AC
  Administered 2018-08-19: 15 mL via ORAL
  Filled 2018-08-19: qty 15

## 2018-08-19 MED ORDER — NAPROXEN 500 MG PO TABS: 500.0000 mg | ORAL_TABLET | Freq: Two times a day (BID) | ORAL | 0 refills | Status: AC

## 2018-08-19 MED ORDER — DEXAMETHASONE SODIUM PHOSPHATE 10 MG/ML IJ SOLN
10.0000 mg | Freq: Once | INTRAMUSCULAR | Status: AC
  Administered 2018-08-19: 10 mg via INTRAVENOUS
  Filled 2018-08-19: qty 1

## 2018-08-19 MED ORDER — KETOROLAC TROMETHAMINE 30 MG/ML IJ SOLN
15.0000 mg | Freq: Once | INTRAMUSCULAR | Status: AC
  Administered 2018-08-19: 15 mg via INTRAVENOUS
  Filled 2018-08-19: qty 1

## 2018-08-19 MED ORDER — HYDROCODONE-ACETAMINOPHEN 5-325 MG PO TABS: 1.0000 | ORAL_TABLET | Freq: Four times a day (QID) | ORAL | 0 refills | Status: AC | PRN

## 2018-08-19 NOTE — ED Triage Notes (Signed)
Pt in via POV, reports ongoing chest pressure since Tuesday, denies any associated symptoms.  Reports being tested for COVID 08/18/18, results unknown at this time.  NAD noted.

## 2018-08-19 NOTE — ED Notes (Signed)
Pt discharged home after verbalizing understanding of discharge instructions; nad noted. 

## 2018-08-19 NOTE — ED Notes (Signed)
Pt to xr 

## 2018-08-19 NOTE — ED Notes (Signed)
Pt via pov from home with chest pressure and tightness that started Monday. She was able to work on Monday, but the pain has gotten worse. She did televisit with her pcp yesterday and was started on Zpak for presumptive bronchitis and tested for covid yesterday. No results at this time. Pt reports familial hx of heart issues in both parents. NAD noted at this time.

## 2018-08-19 NOTE — ED Provider Notes (Signed)
[facility] Emergency Department Provider Note  ____________________________________________   First MD Initiated Contact with Patient 08/19/18 [phone]     (approximate)  I have reviewed the triage vital signs and the nursing notes.   HISTORY  Chief Complaint Chest Pain    HPI Jamie Sanchez is a 48 y.o. female with history of hyperlipidemia, GERD, here with chest pain.  The patient states that starting 2 and half days ago, she developed gradual onset of progressively worsening aching, pressure-like, substernal chest pain with associated mild shortness of breath.  The pain has been constant and essentially unchanged since then.  She has had a mild cough, but states this is not necessarily unusual for her in the setting of her smoking.  She called her PCP and was started on azithromycin and tested for coronavirus, which is not come back.  She denies any fevers.  She has not had any other acute complaints.  She endorses a constant, pressure-like sensation, that is worse with lying flat.  All seems to be worse with movement.  No significant worsening with eating.  No alleviating factors.  Denies personal history of heart disease, but does have strong family history of coronary disease.   Past Medical History:  Diagnosis Date  . GERD (gastroesophageal reflux disease)   . Hyperlipidemia     Patient Active Problem List   Diagnosis Date Noted  . Acute bronchitis 08/18/2018  . Vaginal candida 08/18/2018  . Close Exposure to Covid-19 Virus 08/18/2018  . Colitis 09/30/2017  . Generalized abdominal pain 09/30/2017  . Dysuria 09/30/2017  . Acute pain of right hip 06/24/2017  . Gastroesophageal reflux disease without esophagitis 06/24/2017  . Mixed hyperlipidemia 06/24/2017  . Vitamin D deficiency 06/24/2017  . Generalized anxiety disorder 06/24/2017  . Encounter for general adult medical examination with abnormal findings 06/24/2017    Past Surgical History:   Procedure Laterality Date  . ABDOMINAL HYSTERECTOMY    . APPENDECTOMY    . INCONTINENCE SURGERY    . KNEE ARTHROSCOPY Left     Prior to Admission medications   Medication Sig Start Date End Date Taking? Authorizing Provider  ALPRAZolam (XANAX) 0.25 MG tablet Take 1 tablet (0.25 mg total) by mouth 3 (three) times daily as needed for anxiety. 07/22/18   Ronnell Freshwater, NP  atorvastatin (LIPITOR) 10 MG tablet Take 1 tablet (10 mg total) by mouth every evening. 07/22/18   Ronnell Freshwater, NP  azithromycin (ZITHROMAX) 250 MG tablet z-pack - take as directed for 5 days for acute bronchitis 08/18/18   Ronnell Freshwater, NP  dicyclomine (BENTYL) 10 MG capsule Take 1 capsule (10 mg total) by mouth 4 (four) times daily -  before meals and at bedtime. 09/30/17   Ronnell Freshwater, NP  fluconazole (DIFLUCAN) 150 MG tablet Take 1 tablet po once. May repeat dose in 3 days as needed for persistent symptoms. 08/18/18   Ronnell Freshwater, NP  HYDROcodone-acetaminophen (NORCO/VICODIN) 5-325 MG tablet Take 1-2 tablets by mouth every 6 (six) hours as needed for moderate pain or severe pain. 08/19/18 08/19/19  Duffy Bruce, MD  naproxen (NAPROSYN) 500 MG tablet Take 1 tablet (500 mg total) by mouth 2 (two) times daily with a meal for 7 days. 08/19/18 08/26/18  Duffy Bruce, MD  omeprazole (PRILOSEC) 40 MG capsule Take 1 capsule (40 mg total) by mouth daily. 07/22/18   Ronnell Freshwater, NP  Vitamin D, Ergocalciferol, (DRISDOL) 50000 units CAPS capsule Take 1 capsule (50,000  Units total) by mouth every 7 (seven) days. 06/04/17   Carlean JewsBoscia, Heather E, NP    Allergies Patient has no known allergies.  Family History  Problem Relation Age of Onset  . Heart attack Mother   . Hypertension Father   . Hypertension Sister   . Heart attack Sister   . Hypertension Brother   . Heart attack Brother     Social History Social History   Tobacco Use  . Smoking status: Current Every Day Smoker    Packs/day: 0.50    Types:  Cigarettes  . Smokeless tobacco: Never Used  Substance Use Topics  . Alcohol use: Yes  . Drug use: Not on file    Review of Systems  Review of Systems  Constitutional: Positive for fatigue. Negative for fever.  HENT: Negative for congestion and sore throat.   Eyes: Negative for visual disturbance.  Respiratory: Positive for cough and chest tightness. Negative for shortness of breath.   Cardiovascular: Positive for chest pain.  Gastrointestinal: Negative for abdominal pain, diarrhea, nausea and vomiting.  Genitourinary: Negative for flank pain.  Musculoskeletal: Negative for back pain and neck pain.  Skin: Negative for rash and wound.  Neurological: Negative for weakness.  All other systems reviewed and are negative.    ____________________________________________  PHYSICAL EXAM:      VITAL SIGNS: ED Triage Vitals  Enc Vitals Group     BP 08/19/18 0814 (!) 152/109     Pulse Rate 08/19/18 0814 90     Resp 08/19/18 0814 16     Temp 08/19/18 0814 98.4 F (36.9 C)     Temp Source 08/19/18 0814 Oral     SpO2 08/19/18 0814 98 %     Weight 08/19/18 0815 195 lb (88.5 kg)     Height 08/19/18 0815 5\' 5"  (1.651 m)     Head Circumference --      Peak Flow --      Pain Score 08/19/18 0815 8     Pain Loc --      Pain Edu? --      Excl. in GC? --      Physical Exam Vitals signs and nursing note reviewed.  Constitutional:      General: She is not in acute distress.    Appearance: She is well-developed.  HENT:     Head: Normocephalic and atraumatic.  Eyes:     Conjunctiva/sclera: Conjunctivae normal.  Neck:     Musculoskeletal: Neck supple.  Cardiovascular:     Rate and Rhythm: Normal rate and regular rhythm.     Heart sounds: Normal heart sounds. No murmur. No friction rub.  Pulmonary:     Effort: Pulmonary effort is normal. No respiratory distress.     Breath sounds: Normal breath sounds. No wheezing or rales.  Abdominal:     General: There is no distension.      Palpations: Abdomen is soft.     Tenderness: There is abdominal tenderness. There is no guarding or rebound.     Comments: Minimal epigastric tenderness  Musculoskeletal:     Right lower leg: No edema.     Left lower leg: No edema.  Skin:    General: Skin is warm.     Capillary Refill: Capillary refill takes less than 2 seconds.  Neurological:     Mental Status: She is alert and oriented to person, place, and time.     Motor: No abnormal muscle tone.       ____________________________________________  LABS (all labs ordered are listed, but only abnormal results are displayed)  Labs Reviewed  BASIC METABOLIC PANEL - Abnormal; Notable for the following components:      Result Value   Glucose, Bld 102 (*)    All other components within normal limits  CBC  TROPONIN I (HIGH SENSITIVITY)  FIBRIN DERIVATIVES D-DIMER ([facility] ONLY)  TROPONIN I (HIGH SENSITIVITY)    ____________________________________________  EKG: Normal sinus rhythm, no acute ischemic changes.  Normal intervals.  Normal axis.  No ST or T-segment changes. ________________________________________  RADIOLOGY All imaging, including plain films, CT scans, and ultrasounds, independently reviewed by me, and interpretations confirmed via formal radiology reads.  ED MD interpretation:   Chest x-ray: Normal, possible mild bronchitis pattern but otherwise negative  Official radiology report(s): Dg Chest 2 View  Result Date: 08/19/2018 CLINICAL DATA:  Chest pressure beginning 2 days ago. EXAM: CHEST - 2 VIEW COMPARISON:  None. FINDINGS: Heart size is normal. Mediastinal shadows are normal. There is mild central bronchial thickening but no infiltrate, collapse or effusion. No bone abnormality. IMPRESSION: Possible mild bronchitis pattern. Otherwise negative chest radiography. Electronically Signed   By: Paulina FusiMark  Shogry M.D.   On: 08/19/2018 08:45    ____________________________________________  PROCEDURES   Procedure(s)  performed (including Critical Care):  Procedures  ____________________________________________  INITIAL IMPRESSION / MDM / ASSESSMENT AND PLAN / ED COURSE  As part of my medical decision making, I reviewed the following data within the electronic MEDICAL RECORD NUMBER Notes from prior ED visits and [place] Controlled Substance Database      *Sonda PrimesJudith M Dowe was evaluated in Emergency Department on 08/19/2018 for the symptoms described in the history of present illness. She was evaluated in the context of the global COVID-19 pandemic, which necessitated consideration that the patient might be at risk for infection with the SARS-CoV-2 virus that causes COVID-19. Institutional protocols and algorithms that pertain to the evaluation of patients at risk for COVID-19 are in a state of rapid change based on information released by regulatory bodies including the CDC and federal and state organizations. These policies and algorithms were followed during the patient's care in the ED.  Some ED evaluations and interventions may be delayed as a result of limited staffing during the pandemic.*   Clinical Course as of Aug 18 1048  Thu Aug 19, 2018  [id number] 48 year old female here with substernal chest pressure.  EKG nonischemic.  Troponin undetectable despite constant symptoms for greater than 12 hours, making ACS highly unlikely.  D-dimer negative, making PE or dissection unlikely.  CBC, BMP reassuring.  Unclear primary etiology, but consideration of possible GI etiology so will give GI cocktail.  She has no right upper quadrant tenderness to suggest cholecystitis or cholangitis.  Will plan to reassess after response to treatment.  Chest x-ray clear.  No apparent pulmonary abnormality and she is satting well on room air.   [CI]  1047 Troponin negative as mentioned, with heart score less than 3, does not need additional testing at this time.  Pain unchanged with GI cocktail.  I suspect this could be musculoskeletal chest wall  pain, versus much less likely an occult, mild pericarditis.  Will start on scheduled NSAIDs, give a brief course of analgesics, and advised her to follow-up with her PCP regarding possible bronchitis/COVID.  Return precautions given.   [CI]    Clinical Course User Index [CI] Shaune PollackIsaacs, Talisa Petrak, MD    Medical Decision Making: As above  ____________________________________________  FINAL CLINICAL IMPRESSION(S) / ED  DIAGNOSES  Final diagnoses:  Atypical chest pain     MEDICATIONS GIVEN DURING THIS VISIT:  Medications  ketorolac (TORADOL) 30 MG/ML injection 15 mg (has no administration in time range)  dexamethasone (DECADRON) injection 10 mg (has no administration in time range)  alum & mag hydroxide-simeth (MAALOX/MYLANTA) 200-200-20 MG/5ML suspension 30 mL (30 mLs Oral Given 08/19/18 0951)    And  lidocaine (XYLOCAINE) 2 % viscous mouth solution 15 mL (15 mLs Oral Given 08/19/18 0951)     ED Discharge Orders         Ordered    naproxen (NAPROSYN) 500 MG tablet  2 times daily with meals     08/19/18 1049    HYDROcodone-acetaminophen (NORCO/VICODIN) 5-325 MG tablet  Every 6 hours PRN     08/19/18 1049           Note:  This document was prepared using Dragon voice recognition software and may include unintentional dictation errors.   Shaune PollackIsaacs, Braxon Suder, MD 08/19/18 1050

## 2018-08-19 NOTE — Discharge Instructions (Addendum)
Take the medication as prescribed.  If your coronavirus test comes back positive, stop taking the naproxen, and take Tylenol only.  The stronger pain medication is to help you sleep at night if needed, and for severe pain.  Otherwise, continue your antacids and other medications.  Return to the ER immediately if you develop any worsening pain or shortness of breath.

## 2018-08-25 LAB — NOVEL CORONAVIRUS, NAA: SARS-CoV-2, NAA: NOT DETECTED

## 2018-08-26 ENCOUNTER — Telehealth: Payer: Self-pay

## 2018-08-26 ENCOUNTER — Ambulatory Visit: Payer: BC Managed Care – PPO | Admitting: Adult Health

## 2018-08-26 ENCOUNTER — Other Ambulatory Visit: Payer: Self-pay

## 2018-08-26 ENCOUNTER — Encounter: Payer: Self-pay | Admitting: Adult Health

## 2018-08-26 VITALS — Ht 65.0 in | Wt 195.0 lb

## 2018-08-26 DIAGNOSIS — K219 Gastro-esophageal reflux disease without esophagitis: Secondary | ICD-10-CM | POA: Diagnosis not present

## 2018-08-26 DIAGNOSIS — J209 Acute bronchitis, unspecified: Secondary | ICD-10-CM | POA: Diagnosis not present

## 2018-08-26 DIAGNOSIS — F411 Generalized anxiety disorder: Secondary | ICD-10-CM | POA: Diagnosis not present

## 2018-08-26 NOTE — Telephone Encounter (Signed)
Faxed patient work excuse and put copy in scan. Jamie Sanchez

## 2018-08-26 NOTE — Progress Notes (Signed)
[facility] [address] [place], [place] [address]  Internal MEDICINE  Telephone Visit  Patient Name: Jamie PrimesJudith M Sanchez  [id number] 17, 2072  [id number]  Date of Service: 08/26/2018  I connected with the patient at 1114 by telephone and verified the patients identity using two identifiers.   I discussed the limitations, risks, security and privacy concerns of performing an evaluation and management service by telephone and the availability of in person appointments. I also discussed with the patient that there may be a patient responsible charge related to the service.  The patient expressed understanding and agrees to proceed.    Chief Complaint  Patient presents with  . Telephone Assessment  . Telephone Screen  . Medical Management of Chronic Issues    chest discomfort ,Pt like to go back to work   . Nasal Congestion  . Cough    HPI Pt seen via video. PT reports she was seen last week for chest congestion.  She reports she has taken a zpak and had a negative covid-19 test.  She is requesting to go back to work.  She reports some mild chest congestion at times.  She is feeling much better, and denies any new or worsening symptoms.     Current Medication: Outpatient Encounter Medications as of 08/26/2018  Medication Sig  . ALPRAZolam (XANAX) 0.25 MG tablet Take 1 tablet (0.25 mg total) by mouth 3 (three) times daily as needed for anxiety.  Marland Kitchen. atorvastatin (LIPITOR) 10 MG tablet Take 1 tablet (10 mg total) by mouth every evening.  . dicyclomine (BENTYL) 10 MG capsule Take 1 capsule (10 mg total) by mouth 4 (four) times daily -  before meals and at bedtime.  . fluconazole (DIFLUCAN) 150 MG tablet Take 1 tablet po once. May repeat dose in 3 days as needed for persistent symptoms.  Marland Kitchen. HYDROcodone-acetaminophen (NORCO/VICODIN) 5-325 MG tablet Take 1-2 tablets by mouth every 6 (six) hours as needed for moderate pain or severe pain.  . naproxen (NAPROSYN) 500 MG tablet Take 1 tablet (500 mg  total) by mouth 2 (two) times daily with a meal for 7 days.  Marland Kitchen. omeprazole (PRILOSEC) 40 MG capsule Take 1 capsule (40 mg total) by mouth daily.  . Vitamin D, Ergocalciferol, (DRISDOL) 50000 units CAPS capsule Take 1 capsule (50,000 Units total) by mouth every 7 (seven) days.  Marland Kitchen. azithromycin (ZITHROMAX) 250 MG tablet z-pack - take as directed for 5 days for acute bronchitis (Patient not taking: Reported on 08/26/2018)   No facility-administered encounter medications on file as of 08/26/2018.     Surgical History: Past Surgical History:  Procedure Laterality Date  . ABDOMINAL HYSTERECTOMY    . APPENDECTOMY    . INCONTINENCE SURGERY    . KNEE ARTHROSCOPY Left     Medical History: Past Medical History:  Diagnosis Date  . GERD (gastroesophageal reflux disease)   . Hyperlipidemia     Family History: Family History  Problem Relation Age of Onset  . Heart attack Mother   . Hypertension Father   . Hypertension Sister   . Heart attack Sister   . Hypertension Brother   . Heart attack Brother     Social History   Socioeconomic History  . Marital status: Married    Spouse name: Not on file  . Number of children: Not on file  . Years of education: Not on file  . Highest education level: Not on file  Occupational History  . Not on file  Social Needs  .  Financial resource strain: Not on file  . Food insecurity    Worry: Not on file    Inability: Not on file  . Transportation needs    Medical: Not on file    Non-medical: Not on file  Tobacco Use  . Smoking status: Current Every Day Smoker    Packs/day: 0.50    Types: Cigarettes  . Smokeless tobacco: Never Used  Substance and Sexual Activity  . Alcohol use: Yes  . Drug use: Not on file  . Sexual activity: Not on file  Lifestyle  . Physical activity    Days per week: Not on file    Minutes per session: Not on file  . Stress: Not on file  Relationships  . Social Herbalist on phone: Not on file    Gets  together: Not on file    Attends religious service: Not on file    Active member of club or organization: Not on file    Attends meetings of clubs or organizations: Not on file    Relationship status: Not on file  . Intimate partner violence    Fear of current or ex partner: Not on file    Emotionally abused: Not on file    Physically abused: Not on file    Forced sexual activity: Not on file  Other Topics Concern  . Not on file  Social History Narrative  . Not on file      Review of Systems  Constitutional: Negative for chills, fatigue and unexpected weight change.  HENT: Negative for congestion, rhinorrhea, sneezing and sore throat.   Eyes: Negative for photophobia, pain and redness.  Respiratory: Negative for cough, chest tightness and shortness of breath.   Cardiovascular: Negative for chest pain and palpitations.  Gastrointestinal: Negative for abdominal pain, constipation, diarrhea, nausea and vomiting.  Endocrine: Negative.   Genitourinary: Negative for dysuria and frequency.  Musculoskeletal: Negative for arthralgias, back pain, joint swelling and neck pain.  Skin: Negative for rash.  Allergic/Immunologic: Negative.   Neurological: Negative for tremors and numbness.  Hematological: Negative for adenopathy. Does not bruise/bleed easily.  Psychiatric/Behavioral: Negative for behavioral problems and sleep disturbance. The patient is not nervous/anxious.     Vital Signs: Ht 5\' 5"  (1.651 m)   Wt 195 lb (88.5 kg)   BMI 32.45 kg/m    Observation/Objective:  Well appearing, NAD noted at this time.    Assessment/Plan: 1. Acute bronchitis, unspecified organism Resolving.  Some residual cough as expected with bronchitis.  PT is feeling much better, will return to work on Monday 08/30/2018  2. Gastroesophageal reflux disease without esophagitis No current issues, well controlled.    3. Generalized anxiety disorder Stable, no distress at this time. PT will continue  current treatment.   General Counseling: Jamie Sanchez understanding of the findings of today's phone visit and agrees with plan of treatment. I have discussed any further diagnostic evaluation that may be needed or ordered today. We also reviewed her medications today. she has been encouraged to call the office with any questions or concerns that should arise related to todays visit.    No orders of the defined types were placed in this encounter.   No orders of the defined types were placed in this encounter.   Time spent: [facility] Internal medicine

## 2018-09-20 ENCOUNTER — Encounter: Payer: Self-pay | Admitting: Adult Health

## 2018-10-28 ENCOUNTER — Encounter: Payer: BC Managed Care – PPO | Admitting: Nurse Practitioner

## 2018-10-28 ENCOUNTER — Telehealth: Payer: Self-pay

## 2018-10-28 NOTE — Telephone Encounter (Signed)
Denied xanax refills pt need to been seen for further refills

## 2018-11-24 ENCOUNTER — Encounter: Payer: Self-pay | Admitting: Adult Health

## 2018-11-24 ENCOUNTER — Ambulatory Visit: Payer: BC Managed Care – PPO | Admitting: Adult Health

## 2018-11-24 ENCOUNTER — Other Ambulatory Visit: Payer: Self-pay

## 2018-11-24 DIAGNOSIS — J0100 Acute maxillary sinusitis, unspecified: Secondary | ICD-10-CM | POA: Diagnosis not present

## 2018-11-24 DIAGNOSIS — B373 Candidiasis of vulva and vagina: Secondary | ICD-10-CM

## 2018-11-24 DIAGNOSIS — B3731 Acute candidiasis of vulva and vagina: Secondary | ICD-10-CM

## 2018-11-24 MED ORDER — FLUCONAZOLE 150 MG PO TABS: 150.0000 mg | ORAL_TABLET | ORAL | 1 refills | Status: DC

## 2018-11-24 MED ORDER — AMOXICILLIN-POT CLAVULANATE 875-125 MG PO TABS: 1.0000 | ORAL_TABLET | Freq: Two times a day (BID) | ORAL | 0 refills | Status: DC

## 2018-11-24 NOTE — Progress Notes (Signed)
[facility] [address] [place], [place] [address]  Internal MEDICINE  Telephone Visit  Patient Name: Jamie Sanchez  [id number]  [id number]  Date of Service: 11/24/2018  I connected with the patient at 1125 by telephone and verified the patients identity using two identifiers.   I discussed the limitations, risks, security and privacy concerns of performing an evaluation and management service by telephone and the availability of in person appointments. I also discussed with the patient that there may be a patient responsible charge related to the service.  The patient expressed understanding and agrees to proceed.    Chief Complaint  Patient presents with  . Telephone Assessment    pt took covid test today and should get results in the afternoon  . Telephone Screen    work note fax: [phone], attn wanda brooks  . Cough  . Nasal Congestion  . Ear Problem    ears are sore, usually happens every year due to seasonal allergies    HPI PT seen via video.  She reports she has been having some PND, and has been having moderate ear pain. 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  She has been having facial pain and pressure for the last 2 days.  She denies fever or chills.     Current Medication: Outpatient Encounter Medications as of 11/24/2018  Medication Sig  . ALPRAZolam (XANAX) 0.25 MG tablet Take 1 tablet (0.25 mg total) by mouth 3 (three) times daily as needed for anxiety.  Marland Kitchen atorvastatin (LIPITOR) 10 MG tablet Take 1 tablet (10 mg total) by mouth every evening.  . dicyclomine (BENTYL) 10 MG capsule Take 1 capsule (10 mg total) by mouth 4 (four) times daily -  before meals and at bedtime.  Marland Kitchen HYDROcodone-acetaminophen (NORCO/VICODIN) 5-325 MG tablet Take 1-2 tablets by mouth every 6 (six) hours as needed for moderate pain or severe pain.  Marland Kitchen omeprazole (PRILOSEC) 40 MG capsule Take 1 capsule (40 mg total) by  mouth daily.  . Vitamin D, Ergocalciferol, (DRISDOL) 50000 units CAPS capsule Take 1 capsule (50,000 Units total) by mouth every 7 (seven) days.  Marland Kitchen amoxicillin-clavulanate (AUGMENTIN) 875-125 MG tablet Take 1 tablet by mouth 2 (two) times daily.  . fluconazole (DIFLUCAN) 150 MG tablet Take 1 tablet (150 mg total) by mouth every 3 (three) days.  . [DISCONTINUED] azithromycin (ZITHROMAX) 250 MG tablet z-pack - take as directed for 5 days for acute bronchitis (Patient not taking: Reported on 08/26/2018)  . [DISCONTINUED] fluconazole (DIFLUCAN) 150 MG tablet Take 1 tablet po once. May repeat dose in 3 days as needed for persistent symptoms. (Patient not taking: Reported on 11/24/2018)   No facility-administered encounter medications on file as of 11/24/2018.     Surgical History: Past Surgical History:  Procedure Laterality Date  . ABDOMINAL HYSTERECTOMY    . APPENDECTOMY    . INCONTINENCE SURGERY    . KNEE ARTHROSCOPY Left     Medical History: Past Medical History:  Diagnosis Date  . GERD (gastroesophageal reflux disease)   . Hyperlipidemia     Family History: Family History  Problem Relation Age of Onset  . Heart attack Mother   . Hypertension Father   . Hypertension Sister   . Heart attack Sister   . Hypertension Brother   . Heart attack Brother     Social History   Socioeconomic History  . Marital status: Married  Spouse name: Not on file  . Number of children: Not on file  . Years of education: Not on file  . Highest education level: Not on file  Occupational History  . Not on file  Social Needs  . Financial resource strain: Not on file  . Food insecurity    Worry: Not on file    Inability: Not on file  . Transportation needs    Medical: Not on file    Non-medical: Not on file  Tobacco Use  . Smoking status: Current Every Day Smoker    Packs/day: 0.50    Types: Cigarettes  . Smokeless tobacco: Never Used  Substance and Sexual Activity  . Alcohol use: Yes   . Drug use: Never  . Sexual activity: Not on file  Lifestyle  . Physical activity    Days per week: Not on file    Minutes per session: Not on file  . Stress: Not on file  Relationships  . Social Herbalist on phone: Not on file    Gets together: Not on file    Attends religious service: Not on file    Active member of club or organization: Not on file    Attends meetings of clubs or organizations: Not on file    Relationship status: Not on file  . Intimate partner violence    Fear of current or ex partner: Not on file    Emotionally abused: Not on file    Physically abused: Not on file    Forced sexual activity: Not on file  Other Topics Concern  . Not on file  Social History Narrative  . Not on file      Review of Systems  Constitutional: Negative for chills, fatigue and unexpected weight change.  HENT: Positive for ear pain, postnasal drip, rhinorrhea, sinus pressure and sinus pain. Negative for congestion, sneezing and sore throat.   Eyes: Negative for photophobia, pain and redness.  Respiratory: Negative for cough, chest tightness and shortness of breath.   Cardiovascular: Negative for chest pain and palpitations.  Gastrointestinal: Negative for abdominal pain, constipation, diarrhea, nausea and vomiting.  Endocrine: Negative.   Genitourinary: Negative for dysuria and frequency.  Musculoskeletal: Negative for arthralgias, back pain, joint swelling and neck pain.  Skin: Negative for rash.  Allergic/Immunologic: Negative.   Neurological: Negative for tremors and numbness.  Hematological: Negative for adenopathy. Does not bruise/bleed easily.  Psychiatric/Behavioral: Negative for behavioral problems and sleep disturbance. The patient is not nervous/anxious.     Vital Signs: There were no vitals taken for this visit.   Observation/Objective:  Well appearing, NAD noted at this time.    Assessment/Plan: 1. Acute maxillary sinusitis, recurrence not  specified Advised patient to take entire course of antibiotics as prescribed with food. Pt should return to clinic in 7-10 days if symptoms fail to improve or new symptoms develop.  - amoxicillin-clavulanate (AUGMENTIN) 875-125 MG tablet; Take 1 tablet by mouth 2 (two) times daily.  Dispense: 20 tablet; Refill: 0  2. Vaginal candida Sent difulcan for yeast infection from antibiotics.  - fluconazole (DIFLUCAN) 150 MG tablet; Take 1 tablet (150 mg total) by mouth every 3 (three) days.  Dispense: 3 tablet; Refill: 1  General Counseling: sanaai doane understanding of the findings of today's phone visit and agrees with plan of treatment. I have discussed any further diagnostic evaluation that may be needed or ordered today. We also reviewed her medications today. she has been encouraged to call the office  with any questions or concerns that should arise related to todays visit.    No orders of the defined types were placed in this encounter.   Meds ordered this encounter  Medications  . amoxicillin-clavulanate (AUGMENTIN) 875-125 MG tablet    Sig: Take 1 tablet by mouth 2 (two) times daily.    Dispense:  20 tablet    Refill:  0  . fluconazole (DIFLUCAN) 150 MG tablet    Sig: Take 1 tablet (150 mg total) by mouth every 3 (three) days.    Dispense:  3 tablet    Refill:  1    Time spent: 15 Minutes    Blima LedgerAdam Jahari Wiginton AGNP-C Internal medicine

## 2018-12-06 ENCOUNTER — Other Ambulatory Visit: Payer: Self-pay | Admitting: Adult Health

## 2018-12-06 DIAGNOSIS — F411 Generalized anxiety disorder: Secondary | ICD-10-CM

## 2018-12-07 MED ORDER — ALPRAZOLAM 0.25 MG PO TABS: 0.2500 mg | ORAL_TABLET | Freq: Three times a day (TID) | ORAL | 1 refills | Status: DC | PRN

## 2019-01-03 DIAGNOSIS — Z03818 Encounter for observation for suspected exposure to other biological agents ruled out: Secondary | ICD-10-CM | POA: Diagnosis not present

## 2019-02-07 ENCOUNTER — Telehealth: Payer: Self-pay

## 2019-02-07 NOTE — Telephone Encounter (Signed)
Patient called to let us know tested positive for covid, does not want to schedule appointment at this time will call back if patient would like to be seen. klh

## 2019-02-23 ENCOUNTER — Other Ambulatory Visit: Payer: Self-pay

## 2019-02-23 ENCOUNTER — Ambulatory Visit: Payer: BC Managed Care – PPO | Admitting: Adult Health

## 2019-02-23 ENCOUNTER — Encounter: Payer: Self-pay | Admitting: Adult Health

## 2019-02-23 VITALS — Temp 97.3°F

## 2019-02-23 DIAGNOSIS — Z8616 Personal history of COVID-19: Secondary | ICD-10-CM | POA: Diagnosis not present

## 2019-02-23 DIAGNOSIS — K219 Gastro-esophageal reflux disease without esophagitis: Secondary | ICD-10-CM

## 2019-02-23 NOTE — Progress Notes (Signed)
[facility] [place], [place] [address]  Internal MEDICINE  Telephone Visit  Patient Name: Jamie Sanchez  [id number]  [id number]  Date of Service: 02/23/2019  I connected with the patient at 1142 by telephone and verified the patients identity using two identifiers.   I discussed the limitations, risks, security and privacy concerns of performing an evaluation and management service by telephone and the availability of in person appointments. I also discussed with the patient that there may be a patient responsible charge related to the service.  The patient expressed understanding and agrees to proceed.    Chief Complaint  Patient presents with  . Telephone Assessment  . Telephone Screen  . Nasal Congestion  . Cough    HPI  Pt seen via telephone.  She reports she has recovered from corona virus.  She has a negative test, but her work requires a note to return to work. She does have some intermittent fatigue, but overall is feeling much better. Her sense of taste and smell is slowly returning.  She has provided a fax number, for [place] to send a note.    Current Medication: Outpatient Encounter Medications as of 02/23/2019  Medication Sig  . ALPRAZolam (XANAX) 0.25 MG tablet Take 1 tablet (0.25 mg total) by mouth 3 (three) times daily as needed for anxiety.  Marland Kitchen amoxicillin-clavulanate (AUGMENTIN) 875-125 MG tablet Take 1 tablet by mouth 2 (two) times daily.  Marland Kitchen atorvastatin (LIPITOR) 10 MG tablet Take 1 tablet (10 mg total) by mouth every evening.  . dicyclomine (BENTYL) 10 MG capsule Take 1 capsule (10 mg total) by mouth 4 (four) times daily -  before meals and at bedtime.  . fluconazole (DIFLUCAN) 150 MG tablet Take 1 tablet (150 mg total) by mouth every 3 (three) days.  Marland Kitchen HYDROcodone-acetaminophen (NORCO/VICODIN) 5-325 MG tablet Take 1-2 tablets by mouth every 6 (six) hours as needed for moderate pain or severe pain.  Marland Kitchen omeprazole (PRILOSEC) 40 MG capsule Take 1  capsule (40 mg total) by mouth daily.  . Vitamin D, Ergocalciferol, (DRISDOL) 50000 units CAPS capsule Take 1 capsule (50,000 Units total) by mouth every 7 (seven) days.   No facility-administered encounter medications on file as of 02/23/2019.    Surgical History: Past Surgical History:  Procedure Laterality Date  . ABDOMINAL HYSTERECTOMY    . APPENDECTOMY    . INCONTINENCE SURGERY    . KNEE ARTHROSCOPY Left     Medical History: Past Medical History:  Diagnosis Date  . GERD (gastroesophageal reflux disease)   . Hyperlipidemia     Family History: Family History  Problem Relation Age of Onset  . Heart attack Mother   . Hypertension Father   . Hypertension Sister   . Heart attack Sister   . Hypertension Brother   . Heart attack Brother     Social History   Socioeconomic History  . Marital status: Married    Spouse name: Not on file  . Number of children: Not on file  . Years of education: Not on file  . Highest education level: Not on file  Occupational History  . Not on file  Tobacco Use  . Smoking status: Current Every Day Smoker    Packs/day: 0.50    Types: Cigarettes  . Smokeless tobacco: Never Used  Substance and Sexual Activity  . Alcohol use: Yes  . Drug use: Never  . Sexual activity: Not on file  Other Topics Concern  . Not on file  Social History Narrative  . Not on file   Social Determinants of Health   Financial Resource Strain:   . Difficulty of Paying Living Expenses: Not on file  Food Insecurity:   . Worried About Programme researcher, broadcasting/film/video in the Last Year: Not on file  . Ran Out of Food in the Last Year: Not on file  Transportation Needs:   . Lack of Transportation (Medical): Not on file  . Lack of Transportation (Non-Medical): Not on file  Physical Activity:   . Days of Exercise per Week: Not on file  . Minutes of Exercise per Session: Not on file  Stress:   . Feeling of Stress : Not on file  Social Connections:   . Frequency of  Communication with Friends and Family: Not on file  . Frequency of Social Gatherings with Friends and Family: Not on file  . Attends Religious Services: Not on file  . Active Member of Clubs or Organizations: Not on file  . Attends Banker Meetings: Not on file  . Marital Status: Not on file  Intimate Partner Violence:   . Fear of Current or Ex-Partner: Not on file  . Emotionally Abused: Not on file  . Physically Abused: Not on file  . Sexually Abused: Not on file      Review of Systems  Constitutional: Negative for chills, fatigue and unexpected weight change.  HENT: Negative for congestion, rhinorrhea, sneezing and sore throat.   Eyes: Negative for photophobia, pain and redness.  Respiratory: Negative for cough, chest tightness and shortness of breath.   Cardiovascular: Negative for chest pain and palpitations.  Gastrointestinal: Negative for abdominal pain, constipation, diarrhea, nausea and vomiting.  Endocrine: Negative.   Genitourinary: Negative for dysuria and frequency.  Musculoskeletal: Negative for arthralgias, back pain, joint swelling and neck pain.  Skin: Negative for rash.  Allergic/Immunologic: Negative.   Neurological: Negative for tremors and numbness.  Hematological: Negative for adenopathy. Does not bruise/bleed easily.  Psychiatric/Behavioral: Negative for behavioral problems and sleep disturbance. The patient is not nervous/anxious.     Vital Signs: Temp (!) 97.3 F (36.3 C)    Observation/Objective:   well sounding, NAd noted.    Assessment/Plan: 1. History of 2019 novel coronavirus disease (COVID-19) Recovered, negative test on file.  Note to return to work faxed to patients job.   2. Gastroesophageal reflux disease without esophagitis Stable, continue present management.   General Counseling: Jamie Sanchez understanding of the findings of today's phone visit and agrees with plan of treatment. I have discussed any further  diagnostic evaluation that may be needed or ordered today. We also reviewed her medications today. she has been encouraged to call the office with any questions or concerns that should arise related to todays visit.    No orders of the defined types were placed in this encounter.   No orders of the defined types were placed in this encounter.   Time spent: 15 Minutes    Blima Ledger AGNP-C Internal medicine

## 2019-03-08 ENCOUNTER — Ambulatory Visit: Payer: BC Managed Care – PPO | Admitting: Adult Health

## 2019-03-08 VITALS — Temp 97.5°F | Ht 65.0 in | Wt 200.0 lb

## 2019-03-08 DIAGNOSIS — F17210 Nicotine dependence, cigarettes, uncomplicated: Secondary | ICD-10-CM | POA: Diagnosis not present

## 2019-03-08 DIAGNOSIS — Z8616 Personal history of COVID-19: Secondary | ICD-10-CM | POA: Diagnosis not present

## 2019-03-08 DIAGNOSIS — K219 Gastro-esophageal reflux disease without esophagitis: Secondary | ICD-10-CM

## 2019-03-08 NOTE — Progress Notes (Signed)
[facility] [place], [place] [address]  Internal MEDICINE  Telephone Visit  Patient Name: Jamie Sanchez  [id number]  [id number]  Date of Service: 03/08/2019  I connected with the patient at [phone] by telephone and verified the patients identity using two identifiers.   I discussed the limitations, risks, security and privacy concerns of performing an evaluation and management service by telephone and the availability of in person appointments. I also discussed with the patient that there may be a patient responsible charge related to the service.  The patient expressed understanding and agrees to proceed.    Chief Complaint  Patient presents with  . Telephone Assessment  . Telephone Screen  . Cough    pressure on chest when breath   . Rash    bump on arm   . Fatigue    HPI  Pt seen via video.  She reports ongoing cough, and discomfort on her chest with deep inspiration.  This has been bothering her for a couple weeks since her covid diagnosis. She is still dealing with fatigue post covid.  Pt has three small lesions on her left forearm, discussed possibility of shingles, however patient has no pain.    Current Medication: Outpatient Encounter Medications as of 03/08/2019  Medication Sig  . ALPRAZolam (XANAX) 0.25 MG tablet Take 1 tablet (0.25 mg total) by mouth 3 (three) times daily as needed for anxiety.  Marland Kitchen amoxicillin-clavulanate (AUGMENTIN) 875-125 MG tablet Take 1 tablet by mouth 2 (two) times daily.  Marland Kitchen atorvastatin (LIPITOR) 10 MG tablet Take 1 tablet (10 mg total) by mouth every evening.  . dicyclomine (BENTYL) 10 MG capsule Take 1 capsule (10 mg total) by mouth 4 (four) times daily -  before meals and at bedtime.  . fluconazole (DIFLUCAN) 150 MG tablet Take 1 tablet (150 mg total) by mouth every 3 (three) days.  Marland Kitchen HYDROcodone-acetaminophen (NORCO/VICODIN) 5-325 MG tablet Take 1-2 tablets by mouth every 6 (six) hours as needed for moderate pain or  severe pain.  Marland Kitchen omeprazole (PRILOSEC) 40 MG capsule Take 1 capsule (40 mg total) by mouth daily.  . Vitamin D, Ergocalciferol, (DRISDOL) 50000 units CAPS capsule Take 1 capsule (50,000 Units total) by mouth every 7 (seven) days.   No facility-administered encounter medications on file as of 03/08/2019.    Surgical History: Past Surgical History:  Procedure Laterality Date  . ABDOMINAL HYSTERECTOMY    . APPENDECTOMY    . INCONTINENCE SURGERY    . KNEE ARTHROSCOPY Left     Medical History: Past Medical History:  Diagnosis Date  . GERD (gastroesophageal reflux disease)   . Hyperlipidemia     Family History: Family History  Problem Relation Age of Onset  . Heart attack Mother   . Hypertension Father   . Hypertension Sister   . Heart attack Sister   . Hypertension Brother   . Heart attack Brother     Social History   Socioeconomic History  . Marital status: Married    Spouse name: Not on file  . Number of children: Not on file  . Years of education: Not on file  . Highest education level: Not on file  Occupational History  . Not on file  Tobacco Use  . Smoking status: Current Every Day Smoker    Packs/day: 0.50    Types: Cigarettes  . Smokeless tobacco: Never Used  Substance and Sexual Activity  . Alcohol use: Yes  . Drug use: Never  . Sexual activity:  Not on file  Other Topics Concern  . Not on file  Social History Narrative  . Not on file   Social Determinants of Health   Financial Resource Strain:   . Difficulty of Paying Living Expenses: Not on file  Food Insecurity:   . Worried About Programme researcher, broadcasting/film/video in the Last Year: Not on file  . Ran Out of Food in the Last Year: Not on file  Transportation Needs:   . Lack of Transportation (Medical): Not on file  . Lack of Transportation (Non-Medical): Not on file  Physical Activity:   . Days of Exercise per Week: Not on file  . Minutes of Exercise per Session: Not on file  Stress:   . Feeling of Stress :  Not on file  Social Connections:   . Frequency of Communication with Friends and Family: Not on file  . Frequency of Social Gatherings with Friends and Family: Not on file  . Attends Religious Services: Not on file  . Active Member of Clubs or Organizations: Not on file  . Attends Banker Meetings: Not on file  . Marital Status: Not on file  Intimate Partner Violence:   . Fear of Current or Ex-Partner: Not on file  . Emotionally Abused: Not on file  . Physically Abused: Not on file  . Sexually Abused: Not on file      Review of Systems  Constitutional: Negative for chills, fatigue and unexpected weight change.  HENT: Negative for congestion, rhinorrhea, sneezing and sore throat.   Eyes: Negative for photophobia, pain and redness.  Respiratory: Negative for cough, chest tightness and shortness of breath.   Cardiovascular: Negative for chest pain and palpitations.  Gastrointestinal: Negative for abdominal pain, constipation, diarrhea, nausea and vomiting.  Endocrine: Negative.   Genitourinary: Negative for dysuria and frequency.  Musculoskeletal: Negative for arthralgias, back pain, joint swelling and neck pain.  Skin: Negative for rash.  Allergic/Immunologic: Negative.   Neurological: Negative for tremors and numbness.  Hematological: Negative for adenopathy. Does not bruise/bleed easily.  Psychiatric/Behavioral: Negative for behavioral problems and sleep disturbance. The patient is not nervous/anxious.     Vital Signs: Temp (!) 97.5 F (36.4 C)   Ht 5\' 5"  (1.651 m)   Wt 200 lb (90.7 kg)   BMI 33.28 kg/m    Observation/Objective:  Well appearing, NAD noted.   Assessment/Plan: 1. History of 2019 novel coronavirus disease (COVID-19) Pt continues to have lingering cough, and fatigue.  She is back working.  Denies and fever or productivity to cough.  Unfortunately she continues to smoke.  Continue to monitor.  instructed patient to return to clinic if she has  new or worsening symptoms.  2. Gastroesophageal reflux disease without esophagitis Controlled, continue present management.  3. Cigarette nicotine dependence without complication Smoking cessation counseling: 1. Pt acknowledges the risks of long term smoking, she will try to quite smoking. 2. Options for different medications including nicotine products, chewing gum, patch etc, Wellbutrin and Chantix is discussed 3. Goal and date of compete cessation is discussed 4. Total time spent in smoking cessation is 15 min.   General Counseling: bella brummet understanding of the findings of today's phone visit and agrees with plan of treatment. I have discussed any further diagnostic evaluation that may be needed or ordered today. We also reviewed her medications today. she has been encouraged to call the office with any questions or concerns that should arise related to todays visit.    No orders of  the defined types were placed in this encounter.   No orders of the defined types were placed in this encounter.   Time spent: 20 Minutes   Blima [facility]ger [facility] Internal medicine

## 2019-04-05 ENCOUNTER — Ambulatory Visit: Payer: BC Managed Care – PPO | Admitting: Adult Health

## 2019-04-13 ENCOUNTER — Telehealth: Payer: Self-pay

## 2019-04-13 NOTE — Telephone Encounter (Signed)
Confirmed and screened for 04-15-19 ov. 

## 2019-04-15 ENCOUNTER — Ambulatory Visit (INDEPENDENT_AMBULATORY_CARE_PROVIDER_SITE_OTHER): Payer: BC Managed Care – PPO | Admitting: Nurse Practitioner

## 2019-04-15 ENCOUNTER — Encounter: Payer: Self-pay | Admitting: Nurse Practitioner

## 2019-04-15 ENCOUNTER — Other Ambulatory Visit: Payer: Self-pay

## 2019-04-15 ENCOUNTER — Ambulatory Visit
Admission: RE | Admit: 2019-04-15 | Discharge: 2019-04-15 | Disposition: A | Discharge status: Home / Self Care | Payer: BC Managed Care – PPO | Source: Ambulatory Visit | Attending: Nurse Practitioner | Admitting: Nurse Practitioner

## 2019-04-15 VITALS — BP 172/96 | HR 78 | Temp 97.6°F | Resp 16 | Ht 65.0 in | Wt 208.0 lb

## 2019-04-15 DIAGNOSIS — Z8249 Family history of ischemic heart disease and other diseases of the circulatory system: Secondary | ICD-10-CM

## 2019-04-15 DIAGNOSIS — Z8616 Personal history of COVID-19: Secondary | ICD-10-CM | POA: Diagnosis not present

## 2019-04-15 DIAGNOSIS — K529 Noninfective gastroenteritis and colitis, unspecified: Secondary | ICD-10-CM

## 2019-04-15 DIAGNOSIS — R300 Dysuria: Secondary | ICD-10-CM

## 2019-04-15 DIAGNOSIS — R0609 Other forms of dyspnea: Secondary | ICD-10-CM

## 2019-04-15 DIAGNOSIS — F17210 Nicotine dependence, cigarettes, uncomplicated: Secondary | ICD-10-CM

## 2019-04-15 DIAGNOSIS — R0600 Dyspnea, unspecified: Secondary | ICD-10-CM

## 2019-04-15 DIAGNOSIS — I10 Essential (primary) hypertension: Secondary | ICD-10-CM | POA: Diagnosis not present

## 2019-04-15 DIAGNOSIS — R05 Cough: Secondary | ICD-10-CM | POA: Diagnosis not present

## 2019-04-15 DIAGNOSIS — N390 Urinary tract infection, site not specified: Secondary | ICD-10-CM | POA: Diagnosis not present

## 2019-04-15 LAB — POCT URINALYSIS DIPSTICK
Bilirubin, UA: NEGATIVE
Blood, UA: NEGATIVE
Glucose, UA: NEGATIVE
Ketones, UA: NEGATIVE
Leukocytes, UA: NEGATIVE
Nitrite, UA: NEGATIVE
Protein, UA: POSITIVE — AB
Spec Grav, UA: 1.01 (ref 1.010–1.025)
Urobilinogen, UA: 0.2 E.U./dL
pH, UA: 7.5 (ref 5.0–8.0)

## 2019-04-15 MED ORDER — ATENOLOL 25 MG PO TABS: 25.0000 mg | ORAL_TABLET | Freq: Every day | ORAL | 3 refills | Status: DC

## 2019-04-15 MED ORDER — PHENAZOPYRIDINE HCL 200 MG PO TABS: 200.0000 mg | ORAL_TABLET | Freq: Three times a day (TID) | ORAL | 0 refills | Status: DC | PRN

## 2019-04-15 MED ORDER — AMOXICILLIN-POT CLAVULANATE 875-125 MG PO TABS: 1.0000 | ORAL_TABLET | Freq: Two times a day (BID) | ORAL | 0 refills | Status: DC

## 2019-04-15 NOTE — Progress Notes (Signed)
[facility] [address] [place], [place] [address]  Internal MEDICINE  Office Visit Note  Patient Name: Jamie Sanchez  [id number]  [id number]  Date of Service: 04/17/2019  Chief Complaint  Patient presents with  . Follow-up    post covid issues, possible uti  . Hyperlipidemia  . Gastroesophageal Reflux    The patient is here for routine follow up visit. She states that has been having some issues with flank pain and burning with urination. Her urine is positive for protein and pH of 7.5.  She states that she missed a day of work on 04/07/2019 due to colitis. She has intermittent flares of colitis. This happens every three to four months, lasting for 24 to 48 hours. Sometimes she needs to miss work because of this. Her employer has asked that she have FMLA paperwork filled out to protect her job.  She did have COVID 19 during December 2020. She has family history of heart disease. Since she did have COVID, she continues to have nasal congestion and cough. Has improved since her last visit, but it is persistent.       Current Medication: Outpatient Encounter Medications as of 04/15/2019  Medication Sig  . ALPRAZolam (XANAX) 0.25 MG tablet Take 1 tablet (0.25 mg total) by mouth 3 (three) times daily as needed for anxiety.  Marland Kitchen atorvastatin (LIPITOR) 10 MG tablet Take 1 tablet (10 mg total) by mouth every evening.  . dicyclomine (BENTYL) 10 MG capsule Take 1 capsule (10 mg total) by mouth 4 (four) times daily -  before meals and at bedtime.  Marland Kitchen HYDROcodone-acetaminophen (NORCO/VICODIN) 5-325 MG tablet Take 1-2 tablets by mouth every 6 (six) hours as needed for moderate pain or severe pain.  Marland Kitchen omeprazole (PRILOSEC) 40 MG capsule Take 1 capsule (40 mg total) by mouth daily.  . Vitamin D, Ergocalciferol, (DRISDOL) 50000 units CAPS capsule Take 1 capsule (50,000 Units total) by mouth every 7 (seven) days.  Marland Kitchen amoxicillin-clavulanate (AUGMENTIN) 875-125 MG tablet Take 1 tablet by  mouth 2 (two) times daily.  Marland Kitchen atenolol (TENORMIN) 25 MG tablet Take 1 tablet (25 mg total) by mouth daily.  . phenazopyridine (PYRIDIUM) 200 MG tablet Take 1 tablet (200 mg total) by mouth 3 (three) times daily as needed for pain.  . [DISCONTINUED] amoxicillin-clavulanate (AUGMENTIN) 875-125 MG tablet Take 1 tablet by mouth 2 (two) times daily. (Patient not taking: Reported on 04/15/2019)  . [DISCONTINUED] fluconazole (DIFLUCAN) 150 MG tablet Take 1 tablet (150 mg total) by mouth every 3 (three) days. (Patient not taking: Reported on 04/15/2019)   No facility-administered encounter medications on file as of 04/15/2019.    Surgical History: Past Surgical History:  Procedure Laterality Date  . ABDOMINAL HYSTERECTOMY    . APPENDECTOMY    . INCONTINENCE SURGERY    . KNEE ARTHROSCOPY Left     Medical History: Past Medical History:  Diagnosis Date  . GERD (gastroesophageal reflux disease)   . Hyperlipidemia     Family History: Family History  Problem Relation Age of Onset  . Heart attack Mother   . Hypertension Father   . Hypertension Sister   . Heart attack Sister   . Hypertension Brother   . Heart attack Brother     Social History   Socioeconomic History  . Marital status: Married    Spouse name: Not on file  . Number of children: Not on file  . Years of education: Not on file  . Highest education level: Not on  file  Occupational History  . Not on file  Tobacco Use  . Smoking status: Current Every Day Smoker    Packs/day: 0.50    Types: Cigarettes  . Smokeless tobacco: Never Used  Substance and Sexual Activity  . Alcohol use: Yes  . Drug use: Never  . Sexual activity: Not on file  Other Topics Concern  . Not on file  Social History Narrative  . Not on file   Social Determinants of Health   Financial Resource Strain:   . Difficulty of Paying Living Expenses: Not on file  Food Insecurity:   . Worried About Programme researcher, broadcasting/film/video in the Last Year: Not on file  .  Ran Out of Food in the Last Year: Not on file  Transportation Needs:   . Lack of Transportation (Medical): Not on file  . Lack of Transportation (Non-Medical): Not on file  Physical Activity:   . Days of Exercise per Week: Not on file  . Minutes of Exercise per Session: Not on file  Stress:   . Feeling of Stress : Not on file  Social Connections:   . Frequency of Communication with Friends and Family: Not on file  . Frequency of Social Gatherings with Friends and Family: Not on file  . Attends Religious Services: Not on file  . Active Member of Clubs or Organizations: Not on file  . Attends Banker Meetings: Not on file  . Marital Status: Not on file  Intimate Partner Violence:   . Fear of Current or Ex-Partner: Not on file  . Emotionally Abused: Not on file  . Physically Abused: Not on file  . Sexually Abused: Not on file      Review of Systems  Constitutional: Positive for fatigue. Negative for activity change, chills and unexpected weight change.  HENT: Positive for congestion, postnasal drip, rhinorrhea, sinus pressure and sinus pain. Negative for sneezing and sore throat.   Respiratory: Positive for cough. Negative for chest tightness, shortness of breath and wheezing.   Cardiovascular: Negative for chest pain and palpitations.  Gastrointestinal: Negative for abdominal pain, constipation, diarrhea, nausea and vomiting.  Endocrine: Negative for cold intolerance, heat intolerance, polydipsia and polyuria.  Genitourinary: Positive for dysuria, frequency and urgency.  Musculoskeletal: Positive for arthralgias. Negative for back pain, joint swelling and neck pain.  Skin: Negative for rash.  Allergic/Immunologic: Negative for environmental allergies.  Neurological: Negative for dizziness, tremors, numbness and headaches.  Hematological: Negative for adenopathy. Does not bruise/bleed easily.  Psychiatric/Behavioral: Negative for behavioral problems (Depression), sleep  disturbance and suicidal ideas. The patient is nervous/anxious.     Today's Vitals   04/15/19 1521  BP: (!) 172/96  Pulse: 78  Resp: 16  Temp: 97.6 F (36.4 C)  SpO2: 96%  Weight: 208 lb (94.3 kg)  Height: 5\' 5"  (1.651 m)   Body mass index is 34.61 kg/m.  Physical Exam Vitals and nursing note reviewed.  Constitutional:      General: She is not in acute distress.    Appearance: Normal appearance. She is well-developed. She is not diaphoretic.  HENT:     Head: Normocephalic and atraumatic.     Right Ear: Tympanic membrane is erythematous and bulging.     Left Ear: Tympanic membrane is erythematous and bulging.     Nose: Congestion present.     Right Sinus: Maxillary sinus tenderness and frontal sinus tenderness present.     Left Sinus: Maxillary sinus tenderness and frontal sinus tenderness present.  Mouth/Throat:     Pharynx: No oropharyngeal exudate.  Eyes:     Pupils: Pupils are equal, round, and reactive to light.  Neck:     Thyroid: No thyromegaly.     Vascular: No JVD.     Trachea: No tracheal deviation.  Cardiovascular:     Rate and Rhythm: Normal rate and regular rhythm.     Heart sounds: Normal heart sounds. No murmur. No friction rub. No gallop.   Pulmonary:     Effort: Pulmonary effort is normal. No respiratory distress.     Breath sounds: Normal breath sounds. No wheezing or rales.     Comments: Intermittent,congested, non-productive cough Chest:     Chest wall: No tenderness.  Abdominal:     Palpations: Abdomen is soft.  Musculoskeletal:        General: Normal range of motion.     Cervical back: Normal range of motion and neck supple.  Lymphadenopathy:     Cervical: No cervical adenopathy.  Skin:    General: Skin is warm and dry.  Neurological:     Mental Status: She is alert and oriented to person, place, and time.     Cranial Nerves: No cranial nerve deficit.  Psychiatric:        Mood and Affect: Mood normal.        Behavior: Behavior  normal.        Thought Content: Thought content normal.        Judgment: Judgment normal.    Assessment/Plan: 1. Essential hypertension Start atenolol 25mg  every evening. Discussed diet and lifestyle changes to help lower blood pressure.  - atenolol (TENORMIN) 25 MG tablet; Take 1 tablet (25 mg total) by mouth daily.  Dispense: 30 tablet; Refill: 3  2. Urinary tract infection without hematuria, site unspecified Start augmentin 875mg  twice daily. Send urine for culture and sensitivity and adjust antibiotics as indicated.  - amoxicillin-clavulanate (AUGMENTIN) 875-125 MG tablet; Take 1 tablet by mouth 2 (two) times daily.  Dispense: 20 tablet; Refill: 0  3. Dysuria Add pyridium 200mg  up to three times daily as needed for bladder pain and spasms.  - POCT Urinalysis Dipstick - phenazopyridine (PYRIDIUM) 200 MG tablet; Take 1 tablet (200 mg total) by mouth 3 (three) times daily as needed for pain.  Dispense: 10 tablet; Refill: 0  4. Dyspnea on exertion Will get chest x-ray and echocardiogram for further evaluation.  - DG Chest 2 View; Future - ECHOCARDIOGRAM COMPLETE; Future  5. History of 2019 novel coronavirus disease (COVID-19) Will get chest x-ray and echocardiogram for further evaluation.  - DG Chest 2 View; Future  6. Cigarette nicotine dependence without complication Will get chest x-ray and echocardiogram for further evaluation.  - DG Chest 2 View; Future  7. Family history of heart disease Start atenolol 25mg  daily. Will get echocardiogram for further evaluation.  - ECHOCARDIOGRAM COMPLETE; Future - atenolol (TENORMIN) 25 MG tablet; Take 1 tablet (25 mg total) by mouth daily.  Dispense: 30 tablet; Refill: 3  8. Colitis Patient missed work 04/07/2019 due to flare of colitis. She has intermittent flare of colitis every two to three months and they last for 24-48 hours. Will fill out FMLA paperwork and return to patient's employer.   General Counseling: domanique huesman  understanding of the findings of todays visit and agrees with plan of treatment. I have discussed any further diagnostic evaluation that may be needed or ordered today. We also reviewed her medications today. she has been encouraged to call the  office with any questions or concerns that should arise related to todays visit.  Hypertension Counseling:   The following hypertensive lifestyle modification were recommended and discussed:  1. Limiting alcohol intake to less than 1 oz/day of ethanol:(24 oz of beer or 8 oz of wine or 2 oz of 100-proof whiskey). 2. Take baby ASA 81 mg daily. 3. Importance of regular aerobic exercise and losing weight. 4. Reduce dietary saturated fat and cholesterol intake for overall cardiovascular health. 5. Maintaining adequate dietary potassium, calcium, and magnesium intake. 6. Regular monitoring of the blood pressure. 7. Reduce sodium intake to less than 100 mmol/day (less than 2.3 gm of sodium or less than 6 gm of sodium choride)   This patient was seen by [place] with Dr Lavera Guise as a part of collaborative care agreement  Orders Placed This Encounter  Procedures  . DG Chest 2 View  . POCT Urinalysis Dipstick  . ECHOCARDIOGRAM COMPLETE    Meds ordered this encounter  Medications  . phenazopyridine (PYRIDIUM) 200 MG tablet    Sig: Take 1 tablet (200 mg total) by mouth 3 (three) times daily as needed for pain.    Dispense:  10 tablet    Refill:  0    Order Specific Question:   Supervising Provider    Answer:   Lavera Guise [[id number]]  . amoxicillin-clavulanate (AUGMENTIN) 875-125 MG tablet    Sig: Take 1 tablet by mouth 2 (two) times daily.    Dispense:  20 tablet    Refill:  0    Order Specific Question:   Supervising Provider    Answer:   Lavera Guise [[id number]]  . atenolol (TENORMIN) 25 MG tablet    Sig: Take 1 tablet (25 mg total) by mouth daily.    Dispense:  30 tablet    Refill:  3    Order Specific Question:    Supervising Provider    Answer:   Lavera Guise [[id number]]    Total time spent: 30 Minutes   Time spent includes review of chart, medications, test results, and follow up plan with the patient.      Dr Lavera Guise Internal medicine

## 2019-04-17 DIAGNOSIS — Z8249 Family history of ischemic heart disease and other diseases of the circulatory system: Secondary | ICD-10-CM | POA: Insufficient documentation

## 2019-04-17 DIAGNOSIS — R0609 Other forms of dyspnea: Secondary | ICD-10-CM | POA: Insufficient documentation

## 2019-04-17 DIAGNOSIS — F17210 Nicotine dependence, cigarettes, uncomplicated: Secondary | ICD-10-CM | POA: Insufficient documentation

## 2019-04-17 DIAGNOSIS — N390 Urinary tract infection, site not specified: Secondary | ICD-10-CM | POA: Insufficient documentation

## 2019-04-17 DIAGNOSIS — Z8616 Personal history of COVID-19: Secondary | ICD-10-CM | POA: Insufficient documentation

## 2019-04-17 DIAGNOSIS — I10 Essential (primary) hypertension: Secondary | ICD-10-CM | POA: Insufficient documentation

## 2019-04-17 DIAGNOSIS — I152 Hypertension secondary to endocrine disorders: Secondary | ICD-10-CM | POA: Insufficient documentation

## 2019-04-17 NOTE — Progress Notes (Signed)
Please letthe patient know that her chest x-ray is clear. Thanks.

## 2019-04-18 ENCOUNTER — Telehealth: Payer: Self-pay

## 2019-04-18 NOTE — Telephone Encounter (Signed)
Pt was notified.  

## 2019-04-18 NOTE — Telephone Encounter (Signed)
-----   Message from Carlean Jews, NP sent at 04/17/2019  2:35 PM EST ----- Please letthe patient know that her chest x-ray is clear. Thanks.

## 2019-04-19 ENCOUNTER — Other Ambulatory Visit: Payer: Self-pay | Admitting: Nurse Practitioner

## 2019-04-20 LAB — CBC
Hematocrit: 44.3 % (ref 34.0–46.6)
Hemoglobin: 15.4 g/dL (ref 11.1–15.9)
MCH: 32.8 pg (ref 26.6–33.0)
MCHC: 34.8 g/dL (ref 31.5–35.7)
MCV: 95 fL (ref 79–97)
Platelets: 289 10*3/uL (ref 150–450)
RBC: 4.69 x10E6/uL (ref 3.77–5.28)
RDW: 12.9 % (ref 11.7–15.4)
WBC: 9.9 10*3/uL (ref 3.4–10.8)

## 2019-04-20 LAB — COMPREHENSIVE METABOLIC PANEL
ALT: 17 IU/L (ref 0–32)
AST: 21 IU/L (ref 0–40)
Albumin/Globulin Ratio: 1.8 (ref 1.2–2.2)
Albumin: 4.2 g/dL (ref 3.8–4.9)
Alkaline Phosphatase: 89 IU/L (ref 39–117)
BUN/Creatinine Ratio: 12 (ref 9–23)
BUN: 9 mg/dL (ref 6–24)
Bilirubin Total: 0.5 mg/dL (ref 0.0–1.2)
CO2: 25 mmol/L (ref 20–29)
Calcium: 9.4 mg/dL (ref 8.7–10.2)
Chloride: 104 mmol/L (ref 96–106)
Creatinine, Ser: 0.75 mg/dL (ref 0.57–1.00)
GFR calc Af Amer: 107 mL/min/{1.73_m2} (ref >59)
GFR calc non Af Amer: 93 mL/min/{1.73_m2} (ref >59)
Globulin, Total: 2.3 g/dL (ref 1.5–4.5)
Glucose: 103 mg/dL — ABNORMAL HIGH (ref 65–99)
Potassium: 4.7 mmol/L (ref 3.5–5.2)
Sodium: 142 mmol/L (ref 134–144)
Total Protein: 6.5 g/dL (ref 6.0–8.5)

## 2019-04-20 LAB — LIPID PANEL W/O CHOL/HDL RATIO
Cholesterol, Total: 129 mg/dL (ref 100–199)
HDL: 40 mg/dL (ref >39)
LDL Chol Calc (NIH): 65 mg/dL (ref 0–99)
Triglycerides: 134 mg/dL (ref 0–149)
VLDL Cholesterol Cal: 24 mg/dL (ref 5–40)

## 2019-04-20 LAB — VITAMIN D 25 HYDROXY (VIT D DEFICIENCY, FRACTURES): Vit D, 25-Hydroxy: 20 ng/mL — ABNORMAL LOW (ref 30.0–100.0)

## 2019-04-20 LAB — TSH: TSH: 2.82 u[IU]/mL (ref 0.450–4.500)

## 2019-04-20 LAB — T4, FREE: Free T4: 0.92 ng/dL (ref 0.82–1.77)

## 2019-04-22 ENCOUNTER — Telehealth: Payer: Self-pay

## 2019-04-22 NOTE — Telephone Encounter (Signed)
Faxed patient FMLA paperwork and put copies in scan. Beth

## 2019-04-27 ENCOUNTER — Telehealth: Payer: Self-pay

## 2019-04-27 NOTE — Telephone Encounter (Signed)
Called lmom informing patient of appointment on 04/29/2019. klh °

## 2019-04-27 NOTE — Progress Notes (Signed)
Overall, labs good. Discuss at visit 05/13/2019

## 2019-04-29 ENCOUNTER — Ambulatory Visit: Payer: BC Managed Care – PPO

## 2019-04-29 ENCOUNTER — Other Ambulatory Visit: Payer: Self-pay

## 2019-04-29 DIAGNOSIS — Z8249 Family history of ischemic heart disease and other diseases of the circulatory system: Secondary | ICD-10-CM

## 2019-04-29 DIAGNOSIS — R0609 Other forms of dyspnea: Secondary | ICD-10-CM

## 2019-04-29 DIAGNOSIS — I519 Heart disease, unspecified: Secondary | ICD-10-CM | POA: Diagnosis not present

## 2019-05-11 ENCOUNTER — Telehealth: Payer: Self-pay

## 2019-05-11 NOTE — Telephone Encounter (Signed)
CONFIRMED AND SCREENED FOR 05-13-19 OV. 

## 2019-05-12 NOTE — Progress Notes (Signed)
Normal LVF. Trace valvular regurgitation.  Diastolic dysfunction. Discuss possible sleep study for further evaluation. Review with patient at visit 05/13/2019

## 2019-05-13 ENCOUNTER — Encounter: Payer: Self-pay | Admitting: Nurse Practitioner

## 2019-05-13 ENCOUNTER — Ambulatory Visit (INDEPENDENT_AMBULATORY_CARE_PROVIDER_SITE_OTHER): Payer: BC Managed Care – PPO | Admitting: Nurse Practitioner

## 2019-05-13 ENCOUNTER — Other Ambulatory Visit: Payer: Self-pay

## 2019-05-13 VITALS — BP 140/78 | HR 68 | Temp 97.6°F | Resp 16 | Ht 65.0 in | Wt 202.0 lb

## 2019-05-13 DIAGNOSIS — I10 Essential (primary) hypertension: Secondary | ICD-10-CM | POA: Diagnosis not present

## 2019-05-13 DIAGNOSIS — Z8249 Family history of ischemic heart disease and other diseases of the circulatory system: Secondary | ICD-10-CM | POA: Diagnosis not present

## 2019-05-13 DIAGNOSIS — R0600 Dyspnea, unspecified: Secondary | ICD-10-CM | POA: Diagnosis not present

## 2019-05-13 DIAGNOSIS — R0609 Other forms of dyspnea: Secondary | ICD-10-CM

## 2019-05-13 DIAGNOSIS — E559 Vitamin D deficiency, unspecified: Secondary | ICD-10-CM

## 2019-05-13 MED ORDER — VITAMIN D (ERGOCALCIFEROL) 1.25 MG (50000 UNIT) PO CAPS: 50000.0000 [IU] | ORAL_CAPSULE | ORAL | 5 refills | Status: DC

## 2019-05-13 NOTE — Progress Notes (Signed)
[facility] [address] [place], [place] [address]  Internal MEDICINE  Office Visit Note  Patient Name: Jamie Sanchez  [id number]  [id number]  Date of Service: 05/28/2019  Chief Complaint  Patient presents with  . Follow-up    echo results    The patient is here for follow up of echocardiogram. She has family history of heart disease. Since she had COVID 19, she continues to have cough. Has improved since her last visit, but it is persistent. Her echocardiogram shows normal LVEF with mild valvular regurgitation.  Her echocardiogram shows diastolic dysfunction. Discussed importance of evaluation for sleep apnea. She wishes to hold off on this for now as she recently lost her job and her health insurance will expire.  Her lab work indicates that she has vitamin d deficiency.        Current Medication: Outpatient Encounter Medications as of 05/13/2019  Medication Sig  . amoxicillin-clavulanate (AUGMENTIN) 875-125 MG tablet Take 1 tablet by mouth 2 (two) times daily.  Marland Kitchen atenolol (TENORMIN) 25 MG tablet Take 1 tablet (25 mg total) by mouth daily.  Marland Kitchen atorvastatin (LIPITOR) 10 MG tablet Take 1 tablet (10 mg total) by mouth every evening.  . dicyclomine (BENTYL) 10 MG capsule Take 1 capsule (10 mg total) by mouth 4 (four) times daily -  before meals and at bedtime.  Marland Kitchen HYDROcodone-acetaminophen (NORCO/VICODIN) 5-325 MG tablet Take 1-2 tablets by mouth every 6 (six) hours as needed for moderate pain or severe pain.  Marland Kitchen omeprazole (PRILOSEC) 40 MG capsule Take 1 capsule (40 mg total) by mouth daily.  . phenazopyridine (PYRIDIUM) 200 MG tablet Take 1 tablet (200 mg total) by mouth 3 (three) times daily as needed for pain.  . [DISCONTINUED] ALPRAZolam (XANAX) 0.25 MG tablet Take 1 tablet (0.25 mg total) by mouth 3 (three) times daily as needed for anxiety.  . [DISCONTINUED] Vitamin D, Ergocalciferol, (DRISDOL) 50000 units CAPS capsule Take 1 capsule (50,000 Units total) by mouth  every 7 (seven) days.  . Vitamin D, Ergocalciferol, (DRISDOL) 1.25 MG (50000 UNIT) CAPS capsule Take 1 capsule (50,000 Units total) by mouth every 7 (seven) days.   No facility-administered encounter medications on file as of 05/13/2019.    Surgical History: Past Surgical History:  Procedure Laterality Date  . ABDOMINAL HYSTERECTOMY    . APPENDECTOMY    . INCONTINENCE SURGERY    . KNEE ARTHROSCOPY Left     Medical History: Past Medical History:  Diagnosis Date  . GERD (gastroesophageal reflux disease)   . Hyperlipidemia     Family History: Family History  Problem Relation Age of Onset  . Heart attack Mother   . Hypertension Father   . Hypertension Sister   . Heart attack Sister   . Hypertension Brother   . Heart attack Brother     Social History   Socioeconomic History  . Marital status: Married    Spouse name: Not on file  . Number of children: Not on file  . Years of education: Not on file  . Highest education level: Not on file  Occupational History  . Not on file  Tobacco Use  . Smoking status: Current Every Day Smoker    Packs/day: 0.50    Types: Cigarettes  . Smokeless tobacco: Never Used  Substance and Sexual Activity  . Alcohol use: Yes  . Drug use: Never  . Sexual activity: Not on file  Other Topics Concern  . Not on file  Social History Narrative  .  Not on file   Social Determinants of Health   Financial Resource Strain:   . Difficulty of Paying Living Expenses:   Food Insecurity:   . Worried About Programme researcher, broadcasting/film/video in the Last Year:   . Barista in the Last Year:   Transportation Needs:   . Freight forwarder (Medical):   Marland Kitchen Lack of Transportation (Non-Medical):   Physical Activity:   . Days of Exercise per Week:   . Minutes of Exercise per Session:   Stress:   . Feeling of Stress :   Social Connections:   . Frequency of Communication with Friends and Family:   . Frequency of Social Gatherings with Friends and Family:    . Attends Religious Services:   . Active Member of Clubs or Organizations:   . Attends Banker Meetings:   Marland Kitchen Marital Status:   Intimate Partner Violence:   . Fear of Current or Ex-Partner:   . Emotionally Abused:   Marland Kitchen Physically Abused:   . Sexually Abused:       Review of Systems  Constitutional: Negative for activity change, chills, fatigue and unexpected weight change.  HENT: Negative for congestion, postnasal drip, rhinorrhea, sinus pressure, sinus pain, sneezing and sore throat.   Respiratory: Positive for wheezing. Negative for cough, chest tightness and shortness of breath.   Cardiovascular: Negative for chest pain and palpitations.  Gastrointestinal: Negative for abdominal pain, constipation, diarrhea, nausea and vomiting.  Endocrine: Negative for cold intolerance, heat intolerance, polydipsia and polyuria.  Musculoskeletal: Positive for arthralgias. Negative for back pain, joint swelling and neck pain.  Skin: Negative for rash.  Allergic/Immunologic: Negative for environmental allergies.  Neurological: Negative for dizziness, tremors, numbness and headaches.  Hematological: Negative for adenopathy. Does not bruise/bleed easily.  Psychiatric/Behavioral: Negative for behavioral problems (Depression), sleep disturbance and suicidal ideas. The patient is nervous/anxious.     Today's Vitals   05/13/19 1508  BP: 140/78  Pulse: 68  Resp: 16  Temp: 97.6 F (36.4 C)  SpO2: 90%  Weight: 202 lb (91.6 kg)  Height: 5\' 5"  (1.651 m)   Body mass index is 33.61 kg/m.  Physical Exam Vitals and nursing note reviewed.  Constitutional:      General: She is not in acute distress.    Appearance: Normal appearance. She is well-developed. She is not diaphoretic.  HENT:     Head: Normocephalic and atraumatic.     Mouth/Throat:     Pharynx: No oropharyngeal exudate.  Eyes:     Pupils: Pupils are equal, round, and reactive to light.  Neck:     Thyroid: No thyromegaly.      Vascular: No JVD.     Trachea: No tracheal deviation.  Cardiovascular:     Rate and Rhythm: Normal rate and regular rhythm.     Heart sounds: Normal heart sounds. No murmur. No friction rub. No gallop.   Pulmonary:     Effort: Pulmonary effort is normal. No respiratory distress.     Breath sounds: Normal breath sounds. No wheezing or rales.  Chest:     Chest wall: No tenderness.  Abdominal:     Palpations: Abdomen is soft.  Musculoskeletal:        General: Normal range of motion.     Cervical back: Normal range of motion and neck supple.  Lymphadenopathy:     Cervical: No cervical adenopathy.  Skin:    General: Skin is warm and dry.  Neurological:  Mental Status: She is alert and oriented to person, place, and time.     Cranial Nerves: No cranial nerve deficit.  Psychiatric:        Behavior: Behavior normal.        Thought Content: Thought content normal.        Judgment: Judgment normal.    Assessment/Plan: 1. Dyspnea on exertion Reviewed echocardiogram with patient. Echo shows normal LVEF with mild valvular regurgitation.  Her echocardiogram shows diastolic dysfunction. Discussed importance of evaluation for sleep apnea. She wishes to hold off on this for now as she recently lost her job and her health insurance will expire.   2. Family history of heart disease Reviewed echocardiogram with patient. Echo shows normal LVEF with mild valvular regurgitation.  Her echocardiogram shows diastolic dysfunction. Will continue to monitor yearly for surveillance.   3. Essential hypertension Stable. Continue bp medication as prescribed   4. Vitamin D deficiency Start drisdol 50000 iu weekly for next six months. - Vitamin D, Ergocalciferol, (DRISDOL) 1.25 MG (50000 UNIT) CAPS capsule; Take 1 capsule (50,000 Units total) by mouth every 7 (seven) days.  Dispense: 4 capsule; Refill: 5  General Counseling: winfred redel understanding of the findings of todays visit and agrees  with plan of treatment. I have discussed any further diagnostic evaluation that may be needed or ordered today. We also reviewed her medications today. she has been encouraged to call the office with any questions or concerns that should arise related to todays visit.   This patient was seen by [place] with Dr Lavera Guise as a part of collaborative care agreement  Meds ordered this encounter  Medications  . Vitamin D, Ergocalciferol, (DRISDOL) 1.25 MG (50000 UNIT) CAPS capsule    Sig: Take 1 capsule (50,000 Units total) by mouth every 7 (seven) days.    Dispense:  4 capsule    Refill:  5    Order Specific Question:   Supervising Provider    Answer:   Lavera Guise [[id number]]    Total time spent: 25 Minutes   Time spent includes review of chart, medications, test results, and follow up plan with the patient.      Dr Lavera Guise Internal medicine

## 2019-05-23 ENCOUNTER — Other Ambulatory Visit: Payer: Self-pay | Admitting: Adult Health

## 2019-05-23 DIAGNOSIS — F411 Generalized anxiety disorder: Secondary | ICD-10-CM

## 2019-05-25 ENCOUNTER — Other Ambulatory Visit: Payer: Self-pay | Admitting: Nurse Practitioner

## 2019-05-25 DIAGNOSIS — F411 Generalized anxiety disorder: Secondary | ICD-10-CM

## 2019-05-25 MED ORDER — ALPRAZOLAM 0.25 MG PO TABS: 0.2500 mg | ORAL_TABLET | Freq: Three times a day (TID) | ORAL | 2 refills | Status: DC | PRN

## 2019-05-25 NOTE — Progress Notes (Signed)
Renewed prescription for patient's alprazolam and sent to [facility].

## 2019-08-16 ENCOUNTER — Other Ambulatory Visit: Payer: Self-pay

## 2019-08-16 DIAGNOSIS — I10 Essential (primary) hypertension: Secondary | ICD-10-CM

## 2019-08-16 DIAGNOSIS — Z8249 Family history of ischemic heart disease and other diseases of the circulatory system: Secondary | ICD-10-CM

## 2019-08-16 MED ORDER — ATENOLOL 25 MG PO TABS: 25.0000 mg | ORAL_TABLET | Freq: Every day | ORAL | 3 refills | Status: DC

## 2019-09-23 ENCOUNTER — Other Ambulatory Visit: Payer: Self-pay

## 2019-09-23 DIAGNOSIS — K219 Gastro-esophageal reflux disease without esophagitis: Secondary | ICD-10-CM

## 2019-09-23 DIAGNOSIS — E782 Mixed hyperlipidemia: Secondary | ICD-10-CM

## 2019-09-23 MED ORDER — OMEPRAZOLE 40 MG PO CPDR: 40.0000 mg | DELAYED_RELEASE_CAPSULE | Freq: Every day | ORAL | 0 refills | Status: DC

## 2019-09-23 MED ORDER — ATORVASTATIN CALCIUM 10 MG PO TABS: 10.0000 mg | ORAL_TABLET | Freq: Every evening | ORAL | 0 refills | Status: DC

## 2019-12-13 ENCOUNTER — Ambulatory Visit (INDEPENDENT_AMBULATORY_CARE_PROVIDER_SITE_OTHER): Payer: BLUE CROSS/BLUE SHIELD | Admitting: Nurse Practitioner

## 2019-12-13 ENCOUNTER — Other Ambulatory Visit: Payer: Self-pay

## 2019-12-13 ENCOUNTER — Encounter: Payer: Self-pay | Admitting: Nurse Practitioner

## 2019-12-13 VITALS — BP 132/90 | HR 81 | Temp 97.5°F | Resp 16 | Ht 65.0 in | Wt 207.0 lb

## 2019-12-13 DIAGNOSIS — E782 Mixed hyperlipidemia: Secondary | ICD-10-CM | POA: Diagnosis not present

## 2019-12-13 DIAGNOSIS — F411 Generalized anxiety disorder: Secondary | ICD-10-CM

## 2019-12-13 DIAGNOSIS — Z79899 Other long term (current) drug therapy: Secondary | ICD-10-CM | POA: Diagnosis not present

## 2019-12-13 DIAGNOSIS — K219 Gastro-esophageal reflux disease without esophagitis: Secondary | ICD-10-CM | POA: Diagnosis not present

## 2019-12-13 DIAGNOSIS — E559 Vitamin D deficiency, unspecified: Secondary | ICD-10-CM

## 2019-12-13 DIAGNOSIS — I10 Essential (primary) hypertension: Secondary | ICD-10-CM

## 2019-12-13 DIAGNOSIS — Z1231 Encounter for screening mammogram for malignant neoplasm of breast: Secondary | ICD-10-CM

## 2019-12-13 DIAGNOSIS — Z8249 Family history of ischemic heart disease and other diseases of the circulatory system: Secondary | ICD-10-CM

## 2019-12-13 LAB — POCT URINE DRUG SCREEN
Methylenedioxyamphetamine: NOT DETECTED
POC Amphetamine UR: NOT DETECTED
POC BENZODIAZEPINES UR: POSITIVE — AB
POC Barbiturate UR: NOT DETECTED
POC Cocaine UR: NOT DETECTED
POC Ecstasy UR: NOT DETECTED
POC Marijuana UR: NOT DETECTED
POC Methadone UR: NOT DETECTED
POC Methamphetamine UR: NOT DETECTED
POC Opiate Ur: NOT DETECTED
POC Oxycodone UR: NOT DETECTED
POC PHENCYCLIDINE UR: NOT DETECTED
POC TRICYCLICS UR: NOT DETECTED

## 2019-12-13 MED ORDER — ALPRAZOLAM 0.25 MG PO TABS: 0.2500 mg | ORAL_TABLET | Freq: Two times a day (BID) | ORAL | 2 refills | Status: DC | PRN

## 2019-12-13 MED ORDER — OMEPRAZOLE 40 MG PO CPDR: 40.0000 mg | DELAYED_RELEASE_CAPSULE | Freq: Every day | ORAL | 1 refills | Status: DC

## 2019-12-13 MED ORDER — ATENOLOL 25 MG PO TABS: 25.0000 mg | ORAL_TABLET | Freq: Every day | ORAL | 1 refills | Status: DC

## 2019-12-13 MED ORDER — ATORVASTATIN CALCIUM 10 MG PO TABS: 10.0000 mg | ORAL_TABLET | Freq: Every evening | ORAL | 1 refills | Status: DC

## 2019-12-13 MED ORDER — VITAMIN D (ERGOCALCIFEROL) 1.25 MG (50000 UNIT) PO CAPS: 50000.0000 [IU] | ORAL_CAPSULE | ORAL | 5 refills | Status: DC

## 2019-12-13 NOTE — Progress Notes (Signed)
[facility] [address] [place], [place] [address]  Internal MEDICINE  Office Visit Note  Patient Name: Jamie Sanchez  [id number]  [id number]  Date of Service: 01/01/2020  Chief Complaint  Patient presents with  . Follow-up  . Gastroesophageal Reflux  . Hyperlipidemia  . Medication Refill  . controlled substance form    reviewed with PT    The patient is here for follow up visit. She states that she is doing well overall. Blood pressure is well controlled. Has no concerns or complaints. She is due to have screening mammogram. She needs to have refills of routine medications, including her prescription for alprazolam .      Current Medication: Outpatient Encounter Medications as of 12/13/2019  Medication Sig  . ALPRAZolam (XANAX) 0.25 MG tablet Take 1 tablet (0.25 mg total) by mouth 2 (two) times daily as needed for anxiety.  Marland Kitchen atenolol (TENORMIN) 25 MG tablet Take 1 tablet (25 mg total) by mouth daily.  Marland Kitchen atorvastatin (LIPITOR) 10 MG tablet Take 1 tablet (10 mg total) by mouth every evening.  . dicyclomine (BENTYL) 10 MG capsule Take 1 capsule (10 mg total) by mouth 4 (four) times daily -  before meals and at bedtime.  Marland Kitchen omeprazole (PRILOSEC) 40 MG capsule Take 1 capsule (40 mg total) by mouth daily.  . phenazopyridine (PYRIDIUM) 200 MG tablet Take 1 tablet (200 mg total) by mouth 3 (three) times daily as needed for pain.  . Vitamin D, Ergocalciferol, (DRISDOL) 1.25 MG (50000 UNIT) CAPS capsule Take 1 capsule (50,000 Units total) by mouth every 7 (seven) days.  . [DISCONTINUED] ALPRAZolam (XANAX) 0.25 MG tablet Take 1 tablet (0.25 mg total) by mouth 3 (three) times daily as needed for anxiety.  . [DISCONTINUED] amoxicillin-clavulanate (AUGMENTIN) 875-125 MG tablet Take 1 tablet by mouth 2 (two) times daily.  . [DISCONTINUED] atenolol (TENORMIN) 25 MG tablet Take 1 tablet (25 mg total) by mouth daily.  . [DISCONTINUED] atorvastatin (LIPITOR) 10 MG tablet Take 1  tablet (10 mg total) by mouth every evening.  . [DISCONTINUED] omeprazole (PRILOSEC) 40 MG capsule Take 1 capsule (40 mg total) by mouth daily.  . [DISCONTINUED] Vitamin D, Ergocalciferol, (DRISDOL) 1.25 MG (50000 UNIT) CAPS capsule Take 1 capsule (50,000 Units total) by mouth every 7 (seven) days.   No facility-administered encounter medications on file as of 12/13/2019.    Surgical History: Past Surgical History:  Procedure Laterality Date  . ABDOMINAL HYSTERECTOMY    . APPENDECTOMY    . INCONTINENCE SURGERY    . KNEE ARTHROSCOPY Left     Medical History: Past Medical History:  Diagnosis Date  . GERD (gastroesophageal reflux disease)   . Hyperlipidemia     Family History: Family History  Problem Relation Age of Onset  . Heart attack Mother   . Hypertension Father   . Hypertension Sister   . Heart attack Sister   . Hypertension Brother   . Heart attack Brother     Social History   Socioeconomic History  . Marital status: Married    Spouse name: Not on file  . Number of children: Not on file  . Years of education: Not on file  . Highest education level: Not on file  Occupational History  . Not on file  Tobacco Use  . Smoking status: Current Every Day Smoker    Packs/day: 0.50    Types: Cigarettes  . Smokeless tobacco: Never Used  Vaping Use  . Vaping Use: Never used  Substance  and Sexual Activity  . Alcohol use: Yes  . Drug use: Never  . Sexual activity: Not on file  Other Topics Concern  . Not on file  Social History Narrative  . Not on file   Social Determinants of Health   Financial Resource Strain:   . Difficulty of Paying Living Expenses: Not on file  Food Insecurity:   . Worried About Programme researcher, broadcasting/film/videounning Out of Food in the Last Year: Not on file  . Ran Out of Food in the Last Year: Not on file  Transportation Needs:   . Lack of Transportation (Medical): Not on file  . Lack of Transportation (Non-Medical): Not on file  Physical Activity:   . Days of  Exercise per Week: Not on file  . Minutes of Exercise per Session: Not on file  Stress:   . Feeling of Stress : Not on file  Social Connections:   . Frequency of Communication with Friends and Family: Not on file  . Frequency of Social Gatherings with Friends and Family: Not on file  . Attends Religious Services: Not on file  . Active Member of Clubs or Organizations: Not on file  . Attends BankerClub or Organization Meetings: Not on file  . Marital Status: Not on file  Intimate Partner Violence:   . Fear of Current or Ex-Partner: Not on file  . Emotionally Abused: Not on file  . Physically Abused: Not on file  . Sexually Abused: Not on file      Review of Systems  Constitutional: Negative for activity change, chills, fatigue and unexpected weight change.  HENT: Negative for congestion, postnasal drip, rhinorrhea, sinus pressure, sinus pain, sneezing and sore throat.   Respiratory: Negative for cough, chest tightness, shortness of breath and wheezing.   Cardiovascular: Negative for chest pain and palpitations.  Gastrointestinal: Negative for abdominal pain, constipation, diarrhea, nausea and vomiting.  Endocrine: Negative for cold intolerance, heat intolerance, polydipsia and polyuria.  Musculoskeletal: Positive for arthralgias. Negative for back pain, joint swelling and neck pain.  Skin: Negative for rash.  Allergic/Immunologic: Negative for environmental allergies.  Neurological: Negative for dizziness, tremors, numbness and headaches.  Hematological: Negative for adenopathy. Does not bruise/bleed easily.  Psychiatric/Behavioral: Negative for behavioral problems (Depression), sleep disturbance and suicidal ideas. The patient is nervous/anxious.     Today's Vitals   12/13/19 1005  BP: 132/90  Pulse: 81  Resp: 16  Temp: (!) 97.5 F (36.4 C)  SpO2: 97%  Weight: 207 lb (93.9 kg)  Height: 5\' 5"  (1.651 m)   Body mass index is 34.45 kg/m.  Physical Exam Vitals and nursing note  reviewed.  Constitutional:      General: She is not in acute distress.    Appearance: Normal appearance. She is well-developed. She is not diaphoretic.  HENT:     Head: Normocephalic and atraumatic.     Mouth/Throat:     Pharynx: No oropharyngeal exudate.  Eyes:     Pupils: Pupils are equal, round, and reactive to light.  Neck:     Thyroid: No thyromegaly.     Vascular: No JVD.     Trachea: No tracheal deviation.  Cardiovascular:     Rate and Rhythm: Normal rate and regular rhythm.     Heart sounds: Normal heart sounds. No murmur heard.  No friction rub. No gallop.   Pulmonary:     Effort: Pulmonary effort is normal. No respiratory distress.     Breath sounds: Normal breath sounds. No wheezing or rales.  Chest:  Chest wall: No tenderness.  Abdominal:     Palpations: Abdomen is soft.  Musculoskeletal:        General: Normal range of motion.     Cervical back: Normal range of motion and neck supple.  Lymphadenopathy:     Cervical: No cervical adenopathy.  Skin:    General: Skin is warm and dry.  Neurological:     General: No focal deficit present.     Mental Status: She is alert and oriented to person, place, and time.     Cranial Nerves: No cranial nerve deficit.  Psychiatric:        Mood and Affect: Mood normal.        Behavior: Behavior normal.        Thought Content: Thought content normal.        Judgment: Judgment normal.   Assessment/Plan: 1. Essential hypertension Stable. Continue atenolol 25mg  tablets daily.  - atenolol (TENORMIN) 25 MG tablet; Take 1 tablet (25 mg total) by mouth daily.  Dispense: 90 tablet; Refill: 1  2. Mixed hyperlipidemia Lipid panel stable. Continue atorvastatin 10mg  daily.  - atorvastatin (LIPITOR) 10 MG tablet; Take 1 tablet (10 mg total) by mouth every evening.  Dispense: 90 tablet; Refill: 1  3. Gastroesophageal reflux disease without esophagitis Continue omeprazole 40mg  daily. Avoid trigger foods.  - omeprazole (PRILOSEC) 40  MG capsule; Take 1 capsule (40 mg total) by mouth daily.  Dispense: 90 capsule; Refill: 1  4. Generalized anxiety disorder Decreased dose of alprazolam to 0.25mg  twice daily as needed for acute anxiety. New prescription sent to her pharmacy today.  - ALPRAZolam (XANAX) 0.25 MG tablet; Take 1 tablet (0.25 mg total) by mouth 2 (two) times daily as needed for anxiety.  Dispense: 60 tablet; Refill: 2  5. Vitamin D deficiency - Vitamin D, Ergocalciferol, (DRISDOL) 1.25 MG (50000 UNIT) CAPS capsule; Take 1 capsule (50,000 Units total) by mouth every 7 (seven) days.  Dispense: 4 capsule; Refill: 5  6. Encounter for screening mammogram for malignant neoplasm of breast - MM 3D SCREEN BREAST BILATERAL; Future  7. Encounter for long-term (current) use of high-risk medication - POCT Urine Drug Screen appropriately positive for BZO only.  General Counseling: odelle kosier understanding of the findings of todays visit and agrees with plan of treatment. I have discussed any further diagnostic evaluation that may be needed or ordered today. We also reviewed her medications today. she has been encouraged to call the office with any questions or concerns that should arise related to todays visit.  This patient was seen by FNP Collaboration with Dr as a part of collaborative care agreement  Orders Placed This Encounter  Procedures  . MM 3D SCREEN BREAST BILATERAL  . POCT Urine Drug Screen    Meds ordered this encounter  Medications  . ALPRAZolam (XANAX) 0.25 MG tablet    Sig: Take 1 tablet (0.25 mg total) by mouth 2 (two) times daily as needed for anxiety.    Dispense:  60 tablet    Refill:  2    Order Specific Question:   Supervising Provider    Answer:   Edwinna Areola [[id number]]  . atenolol (TENORMIN) 25 MG tablet    Sig: Take 1 tablet (25 mg total) by mouth daily.    Dispense:  90 tablet    Refill:  1    Pt need follow up appt for refills    Order Specific Question:    Supervising Provider  Answer:   Lyndon Code [[id number]]  . atorvastatin (LIPITOR) 10 MG tablet    Sig: Take 1 tablet (10 mg total) by mouth every evening.    Dispense:  90 tablet    Refill:  1    PT NEED APPT FOR REFILLS    Order Specific Question:   Supervising Provider    Answer:   Lyndon Code [[id number]]  . omeprazole (PRILOSEC) 40 MG capsule    Sig: Take 1 capsule (40 mg total) by mouth daily.    Dispense:  90 capsule    Refill:  1    Order Specific Question:   Supervising Provider    Answer:   Lyndon Code [[id number]]  . Vitamin D, Ergocalciferol, (DRISDOL) 1.25 MG (50000 UNIT) CAPS capsule    Sig: Take 1 capsule (50,000 Units total) by mouth every 7 (seven) days.    Dispense:  4 capsule    Refill:  5    Order Specific Question:   Supervising Provider    Answer:   Lyndon Code [[id number]]    Total time spent: 30 Minutes   Time spent includes review of chart, medications, test results, and follow up plan with the patient.      Dr Lyndon Code Internal medicine

## 2020-01-01 ENCOUNTER — Encounter: Payer: Self-pay | Admitting: Nurse Practitioner

## 2020-01-01 DIAGNOSIS — Z1231 Encounter for screening mammogram for malignant neoplasm of breast: Secondary | ICD-10-CM | POA: Insufficient documentation

## 2020-01-01 DIAGNOSIS — Z79899 Other long term (current) drug therapy: Secondary | ICD-10-CM | POA: Insufficient documentation

## 2020-01-27 ENCOUNTER — Ambulatory Visit
Admission: RE | Admit: 2020-01-27 | Discharge: 2020-01-27 | Disposition: A | Discharge status: Home / Self Care | Payer: BLUE CROSS/BLUE SHIELD | Source: Ambulatory Visit | Attending: Nurse Practitioner | Admitting: Nurse Practitioner

## 2020-01-27 ENCOUNTER — Other Ambulatory Visit: Payer: Self-pay

## 2020-01-27 DIAGNOSIS — Z1231 Encounter for screening mammogram for malignant neoplasm of breast: Secondary | ICD-10-CM | POA: Diagnosis present

## 2020-02-16 ENCOUNTER — Ambulatory Visit: Payer: BLUE CROSS/BLUE SHIELD | Admitting: Nurse Practitioner

## 2020-02-24 ENCOUNTER — Ambulatory Visit (INDEPENDENT_AMBULATORY_CARE_PROVIDER_SITE_OTHER): Payer: 59 | Admitting: Nurse Practitioner

## 2020-02-24 ENCOUNTER — Encounter: Payer: Self-pay | Admitting: Nurse Practitioner

## 2020-02-24 DIAGNOSIS — B373 Candidiasis of vulva and vagina: Secondary | ICD-10-CM

## 2020-02-24 DIAGNOSIS — J069 Acute upper respiratory infection, unspecified: Secondary | ICD-10-CM

## 2020-02-24 DIAGNOSIS — R059 Cough, unspecified: Secondary | ICD-10-CM

## 2020-02-24 DIAGNOSIS — B3731 Acute candidiasis of vulva and vagina: Secondary | ICD-10-CM

## 2020-02-24 HISTORY — DX: Acute upper respiratory infection, unspecified: J06.9

## 2020-02-24 MED ORDER — FLUCONAZOLE 150 MG PO TABS: ORAL_TABLET | ORAL | 0 refills | Status: DC

## 2020-02-24 MED ORDER — AMOXICILLIN-POT CLAVULANATE 875-125 MG PO TABS: 1.0000 | ORAL_TABLET | Freq: Two times a day (BID) | ORAL | 0 refills | Status: DC

## 2020-02-24 MED ORDER — PROMETHAZINE-CODEINE 6.25-10 MG/5ML PO SYRP: 5.0000 mL | ORAL_SOLUTION | Freq: Four times a day (QID) | ORAL | 0 refills | Status: DC | PRN

## 2020-02-24 MED ORDER — PREDNISONE 10 MG (21) PO TBPK: ORAL_TABLET | ORAL | 0 refills | Status: DC

## 2020-02-24 NOTE — Progress Notes (Cosign Needed)
[facility] [address] [place], [place] [address]  Internal MEDICINE  Telephone Visit  Patient Name: Jamie Sanchez  [id number]  [id number]  Date of Service: 02/24/2020  I connected with the patient at 1:24pm by telephone and verified the patients identity using two identifiers.   I discussed the limitations, risks, security and privacy concerns of performing an evaluation and management service by telephone and the availability of in person appointments. I also discussed with the patient that there may be a patient responsible charge related to the service.  The patient expressed understanding and agrees to proceed.    Chief Complaint  Patient presents with  . Telephone Screen  . Telephone Assessment  . Cough    The patient has been contacted via telephone for follow up visit due to concerns for spread of novel coronavirus. She presents for sick visit.  -started feeling sick 02/17/2020 -cough, sore throat, wheezing.  Has been taking nyquil, dayquil, flonase nasal spray. She states that she still has significant post nasal drip. States that cough is so significant that it is keeping her up at night. Did have a headache the first few days. She has taken naproxen and this has helped with headache and body aches.  -home COVID 19 test was negative. She has been following home isolation recommendations for COVID 19 despite negative COVID test results.       Current Medication: Outpatient Encounter Medications as of 02/24/2020  Medication Sig  . ALPRAZolam (XANAX) 0.25 MG tablet Take 1 tablet (0.25 mg total) by mouth 2 (two) times daily as needed for anxiety.  Marland Kitchen amoxicillin-clavulanate (AUGMENTIN) 875-125 MG tablet Take 1 tablet by mouth 2 (two) times daily.  Marland Kitchen atenolol (TENORMIN) 25 MG tablet Take 1 tablet (25 mg total) by mouth daily.  Marland Kitchen atorvastatin (LIPITOR) 10 MG tablet Take 1 tablet (10 mg total) by mouth every evening.  . dicyclomine (BENTYL) 10 MG capsule Take 1  capsule (10 mg total) by mouth 4 (four) times daily -  before meals and at bedtime.  . fluconazole (DIFLUCAN) 150 MG tablet Take 1 tablet po once. May repeat dose in 3 days as needed for persistent symptoms.  Marland Kitchen omeprazole (PRILOSEC) 40 MG capsule Take 1 capsule (40 mg total) by mouth daily.  . phenazopyridine (PYRIDIUM) 200 MG tablet Take 1 tablet (200 mg total) by mouth 3 (three) times daily as needed for pain.  . predniSONE (STERAPRED UNI-PAK 21 TAB) 10 MG (21) TBPK tablet 6 day taper - take by mouth as directed for 6 days  . promethazine-codeine (PHENERGAN WITH CODEINE) 6.25-10 MG/5ML syrup Take 5 mLs by mouth every 6 (six) hours as needed for cough.  . Vitamin D, Ergocalciferol, (DRISDOL) 1.25 MG (50000 UNIT) CAPS capsule Take 1 capsule (50,000 Units total) by mouth every 7 (seven) days.   No facility-administered encounter medications on file as of 02/24/2020.    Surgical History: Past Surgical History:  Procedure Laterality Date  . ABDOMINAL HYSTERECTOMY    . APPENDECTOMY    . INCONTINENCE SURGERY    . KNEE ARTHROSCOPY Left     Medical History: Past Medical History:  Diagnosis Date  . GERD (gastroesophageal reflux disease)   . Hyperlipidemia     Family History: Family History  Problem Relation Age of Onset  . Heart attack Mother   . Hypertension Father   . Hypertension Sister   . Heart attack Sister   . Hypertension Brother   . Heart attack Brother  Social History   Socioeconomic History  . Marital status: Married    Spouse name: Not on file  . Number of children: Not on file  . Years of education: Not on file  . Highest education level: Not on file  Occupational History  . Not on file  Tobacco Use  . Smoking status: Current Every Day Smoker    Packs/day: 0.50    Types: Cigarettes  . Smokeless tobacco: Never Used  Vaping Use  . Vaping Use: Never used  Substance and Sexual Activity  . Alcohol use: Yes  . Drug use: Never  . Sexual activity: Not on file   Other Topics Concern  . Not on file  Social History Narrative  . Not on file   Social Determinants of Health   Financial Resource Strain: Not on file  Food Insecurity: Not on file  Transportation Needs: Not on file  Physical Activity: Not on file  Stress: Not on file  Social Connections: Not on file  Intimate Partner Violence: Not on file      Review of Systems  Constitutional: Positive for chills and fatigue. Negative for fever.  HENT: Positive for congestion, postnasal drip, rhinorrhea, sinus pain, sore throat and voice change. Negative for ear pain.   Respiratory: Positive for cough, chest tightness and wheezing.   Cardiovascular: Negative for chest pain and palpitations.  Gastrointestinal: Positive for nausea.  Genitourinary: Positive for flank pain and frequency.       Frequent yeast infection with antibiotic treatment.   Musculoskeletal: Positive for arthralgias, back pain and myalgias.  Allergic/Immunologic: Positive for environmental allergies.  Neurological: Positive for headaches.    Vital Signs: There were no vitals taken for this visit.   Observation/Objective:   The patient is alert and oriented. She is pleasant and answers all questions appropriately. Breathing is non-labored. She is in no acute distress at this time.  The patient has harsh sounding cough. She is nasally congested.    Assessment/Plan: 1. Acute upper respiratory infection Start augmentin 875mg  twice daily for 10 days. Add prednisone taper. Take as directed for 6 days. Rest and increase fluids. Continue using OTC medication to control symptoms.  - amoxicillin-clavulanate (AUGMENTIN) 875-125 MG tablet; Take 1 tablet by mouth 2 (two) times daily.  Dispense: 20 tablet; Refill: 0 - predniSONE (STERAPRED UNI-PAK 21 TAB) 10 MG (21) TBPK tablet; 6 day taper - take by mouth as directed for 6 days  Dispense: 21 tablet; Refill: 0  2. Cough Promethazine/codine cough syrup prescribed. May take 76mls  every six hours as needed. Advised patient not to overuse this medicine and not to mix with other medications or alcohol as it can cause respiratory distress, sleepiness or dizziness. Should also avoid driving. Patient voiced understanding and agreement.  - promethazine-codeine (PHENERGAN WITH CODEINE) 6.25-10 MG/5ML syrup; Take 5 mLs by mouth every 6 (six) hours as needed for cough.  Dispense: 120 mL; Refill: 0  3. Vaginal candida Take diflucan 150mg  once if yeast infection develops. May repeat dose in 3 days for persistent symptoms.  - fluconazole (DIFLUCAN) 150 MG tablet; Take 1 tablet po once. May repeat dose in 3 days as needed for persistent symptoms.  Dispense: 3 tablet; Refill: 0  General Counseling: betti goodenow understanding of the findings of today's phone visit and agrees with plan of treatment. I have discussed any further diagnostic evaluation that may be needed or ordered today. We also reviewed her medications today. she has been encouraged to call the office with any  questions or concerns that should arise related to todays visit.    This patient was seen by Vincent Gros FNP Collaboration with Dr Lyndon Code as a part of collaborative care agreement  Meds ordered this encounter  Medications  . amoxicillin-clavulanate (AUGMENTIN) 875-125 MG tablet    Sig: Take 1 tablet by mouth 2 (two) times daily.    Dispense:  20 tablet    Refill:  0    Order Specific Question:   Supervising Provider    Answer:   Lyndon Code [[id number]]  . fluconazole (DIFLUCAN) 150 MG tablet    Sig: Take 1 tablet po once. May repeat dose in 3 days as needed for persistent symptoms.    Dispense:  3 tablet    Refill:  0    Order Specific Question:   Supervising Provider    Answer:   Lyndon Code [[id number]]  . promethazine-codeine (PHENERGAN WITH CODEINE) 6.25-10 MG/5ML syrup    Sig: Take 5 mLs by mouth every 6 (six) hours as needed for cough.    Dispense:  120 mL    Refill:  0    Order Specific  Question:   Supervising Provider    Answer:   Lyndon Code [[id number]]  . predniSONE (STERAPRED UNI-PAK 21 TAB) 10 MG (21) TBPK tablet    Sig: 6 day taper - take by mouth as directed for 6 days    Dispense:  21 tablet    Refill:  0    Order Specific Question:   Supervising Provider    Answer:   Lyndon Code [[id number]]    Time spent: 28 Minutes    Dr Lyndon Code Internal medicine

## 2020-06-25 ENCOUNTER — Other Ambulatory Visit: Payer: Self-pay | Admitting: Nurse Practitioner

## 2020-06-25 DIAGNOSIS — I10 Essential (primary) hypertension: Secondary | ICD-10-CM

## 2020-07-11 ENCOUNTER — Encounter: Payer: Self-pay | Admitting: Nurse Practitioner

## 2020-07-11 ENCOUNTER — Other Ambulatory Visit: Payer: Self-pay

## 2020-07-11 ENCOUNTER — Ambulatory Visit: Payer: 59 | Admitting: Nurse Practitioner

## 2020-07-11 DIAGNOSIS — E782 Mixed hyperlipidemia: Secondary | ICD-10-CM

## 2020-07-11 DIAGNOSIS — B373 Candidiasis of vulva and vagina: Secondary | ICD-10-CM

## 2020-07-11 DIAGNOSIS — R300 Dysuria: Secondary | ICD-10-CM | POA: Diagnosis not present

## 2020-07-11 DIAGNOSIS — Z0000 Encounter for general adult medical examination without abnormal findings: Secondary | ICD-10-CM

## 2020-07-11 DIAGNOSIS — Z79899 Other long term (current) drug therapy: Secondary | ICD-10-CM

## 2020-07-11 DIAGNOSIS — E559 Vitamin D deficiency, unspecified: Secondary | ICD-10-CM

## 2020-07-11 DIAGNOSIS — I10 Essential (primary) hypertension: Secondary | ICD-10-CM

## 2020-07-11 DIAGNOSIS — M1990 Unspecified osteoarthritis, unspecified site: Secondary | ICD-10-CM

## 2020-07-11 DIAGNOSIS — F411 Generalized anxiety disorder: Secondary | ICD-10-CM

## 2020-07-11 DIAGNOSIS — Z0189 Encounter for other specified special examinations: Secondary | ICD-10-CM

## 2020-07-11 DIAGNOSIS — K219 Gastro-esophageal reflux disease without esophagitis: Secondary | ICD-10-CM | POA: Diagnosis not present

## 2020-07-11 DIAGNOSIS — B3731 Acute candidiasis of vulva and vagina: Secondary | ICD-10-CM

## 2020-07-11 LAB — POCT URINALYSIS DIPSTICK
Bilirubin, UA: NEGATIVE
Blood, UA: NEGATIVE
Glucose, UA: NEGATIVE
Ketones, UA: NEGATIVE
Leukocytes, UA: NEGATIVE
Nitrite, UA: NEGATIVE
Protein, UA: NEGATIVE
Spec Grav, UA: 1.01 (ref 1.010–1.025)
Urobilinogen, UA: 0.2 E.U./dL
pH, UA: 6.5 (ref 5.0–8.0)

## 2020-07-11 LAB — POCT URINE DRUG SCREEN
POC Amphetamine UR: NOT DETECTED
POC BENZODIAZEPINES UR: POSITIVE — AB
POC Barbiturate UR: NOT DETECTED
POC Cocaine UR: NOT DETECTED
POC Ecstasy UR: NOT DETECTED
POC Marijuana UR: NOT DETECTED
POC Methadone UR: NOT DETECTED
POC Methamphetamine UR: NOT DETECTED
POC Opiate Ur: NOT DETECTED
POC Oxycodone UR: NOT DETECTED
POC PHENCYCLIDINE UR: NOT DETECTED
POC TRICYCLICS UR: NOT DETECTED

## 2020-07-11 MED ORDER — OMEPRAZOLE 40 MG PO CPDR: 40.0000 mg | DELAYED_RELEASE_CAPSULE | Freq: Every day | ORAL | 1 refills | Status: DC

## 2020-07-11 MED ORDER — PHENAZOPYRIDINE HCL 200 MG PO TABS: 200.0000 mg | ORAL_TABLET | Freq: Three times a day (TID) | ORAL | 0 refills | Status: DC | PRN

## 2020-07-11 MED ORDER — VITAMIN D (ERGOCALCIFEROL) 1.25 MG (50000 UNIT) PO CAPS: 50000.0000 [IU] | ORAL_CAPSULE | ORAL | 5 refills | Status: DC

## 2020-07-11 MED ORDER — NITROFURANTOIN MONOHYD MACRO 100 MG PO CAPS: 100.0000 mg | ORAL_CAPSULE | Freq: Two times a day (BID) | ORAL | 0 refills | Status: DC

## 2020-07-11 MED ORDER — ATORVASTATIN CALCIUM 10 MG PO TABS: 10.0000 mg | ORAL_TABLET | Freq: Every evening | ORAL | 1 refills | Status: DC

## 2020-07-11 MED ORDER — ATENOLOL 25 MG PO TABS: 25.0000 mg | ORAL_TABLET | Freq: Every day | ORAL | 1 refills | Status: DC

## 2020-07-11 MED ORDER — FLUCONAZOLE 150 MG PO TABS: ORAL_TABLET | ORAL | 0 refills | Status: DC

## 2020-07-11 MED ORDER — MELOXICAM 15 MG PO TABS
15.0000 mg | ORAL_TABLET | Freq: Every day | ORAL | 0 refills | Status: DC
Start: 2020-07-11 — End: 2021-01-09

## 2020-07-11 MED ORDER — ALPRAZOLAM 0.25 MG PO TABS
0.2500 mg | ORAL_TABLET | Freq: Two times a day (BID) | ORAL | 2 refills | Status: DC | PRN
Start: 2020-07-11 — End: 2021-01-09

## 2020-07-11 NOTE — Progress Notes (Signed)
[facility] [place], [place] [address]  Internal MEDICINE  Office Visit Note  Patient Name: Jamie Sanchez  [id number]  [id number]  Date of Service: 07/13/2020  Chief Complaint  Patient presents with  . Follow-up    Refill request, uti, cramps, may have started a few weeks ago     HPI [name] presents for a follow-up visit for med refills, and lower abdominal cramps that she associates with a urinary tract infection.  She does not have any burning with urination but she does have urinary frequency, urinary urgency, and dysuria.  A urinalysis specimen was obtained. - She has a history of arthritis and would like to try something other than diclofenac. - She takes alprazolam as needed for anxiety and is requesting a refill.   Current Medication: Outpatient Encounter Medications as of 07/11/2020  Medication Sig  . meloxicam (MOBIC) 15 MG tablet Take 1 tablet (15 mg total) by mouth daily.  . nitrofurantoin, macrocrystal-monohydrate, (MACROBID) 100 MG capsule Take 1 capsule (100 mg total) by mouth 2 (two) times daily. Use as instructed  . ALPRAZolam (XANAX) 0.25 MG tablet Take 1 tablet (0.25 mg total) by mouth 2 (two) times daily as needed for anxiety.  Marland Kitchen atenolol (TENORMIN) 25 MG tablet Take 1 tablet (25 mg total) by mouth daily.  Marland Kitchen atorvastatin (LIPITOR) 10 MG tablet Take 1 tablet (10 mg total) by mouth every evening.  . fluconazole (DIFLUCAN) 150 MG tablet Take 1 tablet po once. May repeat dose in 3 days as needed for persistent symptoms.  Marland Kitchen omeprazole (PRILOSEC) 40 MG capsule Take 1 capsule (40 mg total) by mouth daily.  . phenazopyridine (PYRIDIUM) 200 MG tablet Take 1 tablet (200 mg total) by mouth 3 (three) times daily as needed for pain.  . Vitamin D, Ergocalciferol, (DRISDOL) 1.25 MG (50000 UNIT) CAPS capsule Take 1 capsule (50,000 Units total) by mouth every 7 (seven) days.  . [DISCONTINUED] ALPRAZolam (XANAX) 0.25 MG tablet Take 1 tablet (0.25 mg total)  by mouth 2 (two) times daily as needed for anxiety.  . [DISCONTINUED] amoxicillin-clavulanate (AUGMENTIN) 875-125 MG tablet Take 1 tablet by mouth 2 (two) times daily.  . [DISCONTINUED] atenolol (TENORMIN) 25 MG tablet Take 1 tablet (25 mg total) by mouth daily.  . [DISCONTINUED] atorvastatin (LIPITOR) 10 MG tablet Take 1 tablet (10 mg total) by mouth every evening.  . [DISCONTINUED] dicyclomine (BENTYL) 10 MG capsule Take 1 capsule (10 mg total) by mouth 4 (four) times daily -  before meals and at bedtime.  . [DISCONTINUED] fluconazole (DIFLUCAN) 150 MG tablet Take 1 tablet po once. May repeat dose in 3 days as needed for persistent symptoms.  . [DISCONTINUED] omeprazole (PRILOSEC) 40 MG capsule Take 1 capsule (40 mg total) by mouth daily.  . [DISCONTINUED] phenazopyridine (PYRIDIUM) 200 MG tablet Take 1 tablet (200 mg total) by mouth 3 (three) times daily as needed for pain.  . [DISCONTINUED] predniSONE (STERAPRED UNI-PAK 21 TAB) 10 MG (21) TBPK tablet 6 day taper - take by mouth as directed for 6 days  . [DISCONTINUED] promethazine-codeine (PHENERGAN WITH CODEINE) 6.25-10 MG/5ML syrup Take 5 mLs by mouth every 6 (six) hours as needed for cough.  . [DISCONTINUED] Vitamin D, Ergocalciferol, (DRISDOL) 1.25 MG (50000 UNIT) CAPS capsule Take 1 capsule (50,000 Units total) by mouth every 7 (seven) days.   No facility-administered encounter medications on file as of 07/11/2020.    Surgical History: Past Surgical History:  Procedure Laterality Date  . ABDOMINAL HYSTERECTOMY    .  APPENDECTOMY    . INCONTINENCE SURGERY    . KNEE ARTHROSCOPY Left     Medical History: Past Medical History:  Diagnosis Date  . GERD (gastroesophageal reflux disease)   . Hyperlipidemia     Family History: Family History  Problem Relation Age of Onset  . Heart attack Mother   . Hypertension Father   . Hypertension Sister   . Heart attack Sister   . Hypertension Brother   . Heart attack Brother     Social  History   Socioeconomic History  . Marital status: Married    Spouse name: Not on file  . Number of children: Not on file  . Years of education: Not on file  . Highest education level: Not on file  Occupational History  . Not on file  Tobacco Use  . Smoking status: Current Every Day Smoker    Packs/day: 0.50    Types: Cigarettes  . Smokeless tobacco: Never Used  Vaping Use  . Vaping Use: Never used  Substance and Sexual Activity  . Alcohol use: Yes  . Drug use: Never  . Sexual activity: Not on file  Other Topics Concern  . Not on file  Social History Narrative  . Not on file   Social Determinants of Health   Financial Resource Strain: Not on file  Food Insecurity: Not on file  Transportation Needs: Not on file  Physical Activity: Not on file  Stress: Not on file  Social Connections: Not on file  Intimate Partner Violence: Not on file      Review of Systems  Constitutional: Negative.  Negative for chills, fatigue and fever.  HENT: Negative.   Respiratory: Negative.  Negative for cough, chest tightness, shortness of breath and wheezing.   Cardiovascular: Negative.  Negative for chest pain.  Gastrointestinal: Negative for constipation, diarrhea, nausea and vomiting.       Lower abdominal cramping.  Genitourinary: Negative for difficulty urinating, dysuria, frequency and urgency.  Musculoskeletal: Negative.   Skin: Negative.   Neurological: Negative.  Negative for headaches.  Psychiatric/Behavioral: The patient is nervous/anxious.     Vital Signs: BP (!) 156/100   Pulse 82   Temp (!) 97.5 F (36.4 C)   Resp 16   Ht _0  (1.651 m)   Wt 203 lb 3.2 oz (92.2 kg)   SpO2 96%   BMI 33.81 kg/m    Physical Exam Vitals reviewed.  Constitutional:      General: She is not in acute distress.    Appearance: Normal appearance. She is well-developed. She is obese. She is not ill-appearing or diaphoretic.  HENT:     Head: Normocephalic and atraumatic.  Neck:      Thyroid: No thyromegaly.     Vascular: No JVD.     Trachea: No tracheal deviation.  Cardiovascular:     Rate and Rhythm: Normal rate and regular rhythm.     Pulses: Normal pulses.     Heart sounds: Normal heart sounds. No murmur heard. No friction rub. No gallop.   Pulmonary:     Effort: Pulmonary effort is normal. No respiratory distress.     Breath sounds: Normal breath sounds. No wheezing or rales.  Chest:     Chest wall: No tenderness.  Abdominal:     General: Bowel sounds are normal.     Palpations: Abdomen is soft.  Musculoskeletal:        General: Normal range of motion.     Cervical back: Normal range of  motion and neck supple.  Lymphadenopathy:     Cervical: No cervical adenopathy.  Skin:    General: Skin is warm and dry.     Capillary Refill: Capillary refill takes less than 2 seconds.  Neurological:     Mental Status: She is alert and oriented to person, place, and time.     Cranial Nerves: No cranial nerve deficit.  Psychiatric:        Mood and Affect: Mood normal.        Behavior: Behavior normal.        Thought Content: Thought content normal.        Judgment: Judgment normal.    Assessment/Plan: 1. Essential hypertension Blood pressure is elevated at today's visit. She reports that it is better at home. Will add amlodipine 5 mg.   - atenolol (TENORMIN) 25 MG tablet; Take 1 tablet (25 mg total) by mouth daily.  Dispense: 90 tablet; Refill: 1 - amlodipine (NORVASC) 5 MG tablet; Take 1 tablet (5 mg total) by mouth daily.  Dispense: 30 tablet; Refill: 0  2. Mixed hyperlipidemia Taking atorvastatin for elevated cholesterol, continue as ordered. Refill ordered.  - atorvastatin (LIPITOR) 10 MG tablet; Take 1 tablet (10 mg total) by mouth every evening.  Dispense: 90 tablet; Refill: 1  3. Generalized anxiety disorder Takes alprazolam for anxiety, refill ordered. - ALPRAZolam (XANAX) 0.25 MG tablet; Take 1 tablet (0.25 mg total) by mouth 2 (two) times daily as  needed for anxiety.  Dispense: 60 tablet; Refill: 2  4. Gastroesophageal reflux disease without esophagitis Acid reflux well-controlled with omeprazole, refill ordered.  - omeprazole (PRILOSEC) 40 MG capsule; Take 1 capsule (40 mg total) by mouth daily.  Dispense: 90 capsule; Refill: 1  5. Arthritis Diclofenac was discontinued, meloxicam prescribed.  - meloxicam (MOBIC) 15 MG tablet; Take 1 tablet (15 mg total) by mouth daily.  Dispense: 30 tablet; Refill: 0  6. Vaginal candida Patient reports that she develops a yeast infection every time she takes an antibiotic. Fluconazole prescribed for prophylaxis while patient is being treated for UTI.  - fluconazole (DIFLUCAN) 150 MG tablet; Take 1 tablet po once. May repeat dose in 3 days as needed for persistent symptoms.  Dispense: 3 tablet; Refill: 0  7. Vitamin D deficiency History of low vitamin D, refill ordered.  - Vitamin D, Ergocalciferol, (DRISDOL) 1.25 MG (50000 UNIT) CAPS capsule; Take 1 capsule (50,000 Units total) by mouth every 7 (seven) days.  Dispense: 4 capsule; Refill: 5  8. Encounter for lab  No recent labs, last labs were drawn in march 2021. Routine labs ordered. - CBC with Differential/Platelet - CMP14+EGFR - TSH + free T4 - B12 and Folate Panel - Vitamin D 1,25 dihydroxy - Lipid Profile  9. Encounter for long-term (current) use of high-risk medication Patient is prescribed alprazolam, specimen obtained for UDS.  - POCT Urine Drug Screen  10. Dysuria Urinalysis was normal, negative for leukocytes and nitrites. Pyridium ordered to alleviate any urinary tract pain. Nitrofurantoin was prescribed due to positive symptoms of UTI.  - POCT Urinalysis Dipstick - phenazopyridine (PYRIDIUM) 200 MG tablet; Take 1 tablet (200 mg total) by mouth 3 (three) times daily as needed for pain.  Dispense: 10 tablet; Refill: 0 - nitrofurantoin, macrocrystal-monohydrate, (MACROBID) 100 MG capsule; Take 1 capsule (100 mg total) by mouth 2  (two) times daily. Use as instructed  Dispense: 20 capsule; Refill: 0    General Counseling: ela moffat understanding of the findings of todays visit and agrees with  plan of treatment. I have discussed any further diagnostic evaluation that may be needed or ordered today. We also reviewed her medications today. she has been encouraged to call the office with any questions or concerns that should arise related to todays visit.    Orders Placed This Encounter  Procedures  . CBC with Differential/Platelet  . CMP14+EGFR  . TSH + free T4  . B12 and Folate Panel  . Vitamin D 1,25 dihydroxy  . Lipid Profile  . POCT Urine Drug Screen  . POCT Urinalysis Dipstick    Meds ordered this encounter  Medications  . atenolol (TENORMIN) 25 MG tablet    Sig: Take 1 tablet (25 mg total) by mouth daily.    Dispense:  90 tablet    Refill:  1  . ALPRAZolam (XANAX) 0.25 MG tablet    Sig: Take 1 tablet (0.25 mg total) by mouth 2 (two) times daily as needed for anxiety.    Dispense:  60 tablet    Refill:  2  . atorvastatin (LIPITOR) 10 MG tablet    Sig: Take 1 tablet (10 mg total) by mouth every evening.    Dispense:  90 tablet    Refill:  1  . omeprazole (PRILOSEC) 40 MG capsule    Sig: Take 1 capsule (40 mg total) by mouth daily.    Dispense:  90 capsule    Refill:  1  . phenazopyridine (PYRIDIUM) 200 MG tablet    Sig: Take 1 tablet (200 mg total) by mouth 3 (three) times daily as needed for pain.    Dispense:  10 tablet    Refill:  0  . fluconazole (DIFLUCAN) 150 MG tablet    Sig: Take 1 tablet po once. May repeat dose in 3 days as needed for persistent symptoms.    Dispense:  3 tablet    Refill:  0  . Vitamin D, Ergocalciferol, (DRISDOL) 1.25 MG (50000 UNIT) CAPS capsule    Sig: Take 1 capsule (50,000 Units total) by mouth every 7 (seven) days.    Dispense:  4 capsule    Refill:  5  . nitrofurantoin, macrocrystal-monohydrate, (MACROBID) 100 MG capsule    Sig: Take 1 capsule (100  mg total) by mouth 2 (two) times daily. Use as instructed    Dispense:  20 capsule    Refill:  0  . meloxicam (MOBIC) 15 MG tablet    Sig: Take 1 tablet (15 mg total) by mouth daily.    Dispense:  30 tablet    Refill:  0   Return in about 6 months (around 01/11/2021) for F/U, med refill, Paizleigh Wilds PCP.  Total time spent:30 Minutes Time spent includes review of chart, medications, test results, and follow up plan with the patient.   [place] Controlled Substance Database was reviewed by me.  This patient was seen by Jonetta Osgood, FNP-C in collaboration with Dr. Clayborn Bigness as a part of collaborative care agreement.   Dr Lavera Guise Internal medicine

## 2020-07-13 MED ORDER — AMLODIPINE BESYLATE 5 MG PO TABS: 5.0000 mg | ORAL_TABLET | Freq: Every day | ORAL | 0 refills | Status: DC

## 2020-07-14 ENCOUNTER — Telehealth: Payer: Self-pay

## 2020-07-14 NOTE — Telephone Encounter (Signed)
-----   Message from Sallyanne Kuster, NP sent at 07/13/2020  7:50 PM EDT ----- Regarding: BP medication added, need patient to follow up sooner Hi Ladies,  I added amlodipine 5 mg daily to this patient's medications. I sent the prescription to her pharmacy. Please call her to let her know since her blood pressure was 156/100 at her office visit this week, I added this medication and would like to have her follow up in 4 weeks to make sure her blood pressure is lowering. I originally told her follow up in 6 months because she was pretty persistent about it but I really do need to see her sooner.   Thank you, Alyssa

## 2020-07-14 NOTE — Telephone Encounter (Signed)
Called [facility] to let pt know we called in prescription for her Bp and for her to call the office on Tuesday to schedule a 4 week fup on Bp.

## 2020-07-19 ENCOUNTER — Encounter: Payer: Self-pay | Admitting: Nurse Practitioner

## 2020-08-11 ENCOUNTER — Other Ambulatory Visit: Payer: Self-pay | Admitting: Nurse Practitioner

## 2020-08-14 ENCOUNTER — Ambulatory Visit: Payer: 59 | Admitting: Nurse Practitioner

## 2020-08-14 ENCOUNTER — Encounter: Payer: Self-pay | Admitting: Nurse Practitioner

## 2020-08-14 ENCOUNTER — Other Ambulatory Visit: Payer: Self-pay

## 2020-08-14 VITALS — BP 126/80 | HR 73 | Temp 98.2°F | Resp 16 | Ht 65.0 in | Wt 205.8 lb

## 2020-08-14 DIAGNOSIS — M1990 Unspecified osteoarthritis, unspecified site: Secondary | ICD-10-CM

## 2020-08-14 DIAGNOSIS — I10 Essential (primary) hypertension: Secondary | ICD-10-CM

## 2020-08-14 DIAGNOSIS — F411 Generalized anxiety disorder: Secondary | ICD-10-CM

## 2020-08-14 DIAGNOSIS — K219 Gastro-esophageal reflux disease without esophagitis: Secondary | ICD-10-CM

## 2020-08-14 DIAGNOSIS — E782 Mixed hyperlipidemia: Secondary | ICD-10-CM

## 2020-08-14 MED ORDER — ALPRAZOLAM 0.25 MG PO TABS: 0.2500 mg | ORAL_TABLET | Freq: Two times a day (BID) | ORAL | 2 refills | Status: DC | PRN

## 2020-08-14 MED ORDER — AMLODIPINE BESYLATE 5 MG PO TABS: ORAL_TABLET | ORAL | 1 refills | Status: DC

## 2020-08-14 MED ORDER — TIZANIDINE HCL 4 MG PO CAPS: 4.0000 mg | ORAL_CAPSULE | Freq: Three times a day (TID) | ORAL | 0 refills | Status: DC | PRN

## 2020-08-14 NOTE — Progress Notes (Signed)
[facility] [address] [place], [place] [address]  Internal MEDICINE  Office Visit Note  Patient Name: Jamie Sanchez  [id number]  [id number]  Date of Service: 08/19/2020  Chief Complaint  Patient presents with   Follow-up    BP check    Gastroesophageal Reflux   Hyperlipidemia    HPI Jamie Sanchez presents for a follow up visit for blood pressure check, gastroesophageal reflux and hyperlipidemia. At her previous visit, amlodipine 5 mg daily was added to her treatment plan. Her blood pressure is much improved at today's visit.  -continues to take alprazolam for anxiety.  -acid reflux remains controlled with omeprazole -taking atorvastatin for hyperlipidemia.     Current Medication: Outpatient Encounter Medications as of 08/14/2020  Medication Sig   ALPRAZolam (XANAX) 0.25 MG tablet Take 1 tablet (0.25 mg total) by mouth 2 (two) times daily as needed for anxiety.   atenolol (TENORMIN) 25 MG tablet Take 1 tablet (25 mg total) by mouth daily.   atorvastatin (LIPITOR) 10 MG tablet Take 1 tablet (10 mg total) by mouth every evening.   meloxicam (MOBIC) 15 MG tablet Take 1 tablet (15 mg total) by mouth daily.   omeprazole (PRILOSEC) 40 MG capsule Take 1 capsule (40 mg total) by mouth daily.   tiZANidine (ZANAFLEX) 4 MG capsule Take 1 capsule (4 mg total) by mouth 3 (three) times daily as needed for muscle spasms (and musculoskeletal pain).   Vitamin D, Ergocalciferol, (DRISDOL) 1.25 MG (50000 UNIT) CAPS capsule Take 1 capsule (50,000 Units total) by mouth every 7 (seven) days.   [DISCONTINUED] amLODipine (NORVASC) 5 MG tablet TAKE 1 TABLET(5 MG) BY MOUTH DAILY   ALPRAZolam (XANAX) 0.25 MG tablet Take 1 tablet (0.25 mg total) by mouth 2 (two) times daily as needed for anxiety.   amLODipine (NORVASC) 5 MG tablet TAKE 1 TABLET(5 MG) BY MOUTH DAILY   [DISCONTINUED] fluconazole (DIFLUCAN) 150 MG tablet Take 1 tablet po once. May repeat dose in 3 days as needed for persistent  symptoms. (Patient not taking: Reported on 08/14/2020)   [DISCONTINUED] nitrofurantoin, macrocrystal-monohydrate, (MACROBID) 100 MG capsule Take 1 capsule (100 mg total) by mouth 2 (two) times daily. Use as instructed (Patient not taking: Reported on 08/14/2020)   [DISCONTINUED] phenazopyridine (PYRIDIUM) 200 MG tablet Take 1 tablet (200 mg total) by mouth 3 (three) times daily as needed for pain. (Patient not taking: Reported on 08/14/2020)   No facility-administered encounter medications on file as of 08/14/2020.    Surgical History: Past Surgical History:  Procedure Laterality Date   ABDOMINAL HYSTERECTOMY     APPENDECTOMY     INCONTINENCE SURGERY     KNEE ARTHROSCOPY Left     Medical History: Past Medical History:  Diagnosis Date   GERD (gastroesophageal reflux disease)    Hyperlipidemia     Family History: Family History  Problem Relation Age of Onset   Heart attack Mother    Hypertension Father    Kidney cancer Father    Hypertension Sister    Heart attack Sister    Hypertension Brother    Heart attack Brother     Social History   Socioeconomic History   Marital status: Married    Spouse name: Not on file   Number of children: Not on file   Years of education: Not on file   Highest education level: Not on file  Occupational History   Not on file  Tobacco Use   Smoking status: Every Day    Packs/day: 0.50  Pack years: 0.00    Types: Cigarettes   Smokeless tobacco: Never  Vaping Use   Vaping Use: Never used  Substance and Sexual Activity   Alcohol use: Yes    Comment: occ   Drug use: Never   Sexual activity: Not on file  Other Topics Concern   Not on file  Social History Narrative   Not on file   Social Determinants of Health   Financial Resource Strain: Not on file  Food Insecurity: Not on file  Transportation Needs: Not on file  Physical Activity: Not on file  Stress: Not on file  Social Connections: Not on file  Intimate Partner Violence:  Not on file      Review of Systems  Constitutional:  Negative for chills, fatigue and unexpected weight change.  HENT:  Negative for congestion, rhinorrhea, sneezing and sore throat.   Eyes:  Negative for redness.  Respiratory:  Negative for cough, chest tightness and shortness of breath.   Cardiovascular:  Negative for chest pain and palpitations.  Gastrointestinal:  Negative for abdominal pain, constipation, diarrhea, nausea and vomiting.  Genitourinary:  Negative for dysuria and frequency.  Musculoskeletal:  Negative for arthralgias, back pain, joint swelling and neck pain.  Skin:  Negative for rash.  Neurological: Negative.  Negative for tremors and numbness.  Hematological:  Negative for adenopathy. Does not bruise/bleed easily.  Psychiatric/Behavioral:  Negative for behavioral problems (Depression), sleep disturbance and suicidal ideas. The patient is not nervous/anxious.    Vital Signs: BP 126/80   Pulse 73   Temp 98.2 F (36.8 C)   Resp 16   Ht 5\' 5"  (1.651 m)   Wt 205 lb 12.8 oz (93.4 kg)   SpO2 97%   BMI 34.25 kg/m    Physical Exam Vitals reviewed.  Constitutional:      Appearance: Normal appearance. She is obese.  HENT:     Head: Normocephalic and atraumatic.  Cardiovascular:     Rate and Rhythm: Normal rate and regular rhythm.     Pulses: Normal pulses.     Heart sounds: Normal heart sounds. No murmur heard. Pulmonary:     Effort: Pulmonary effort is normal. No respiratory distress.     Breath sounds: Normal breath sounds.  Skin:    General: Skin is warm and dry.     Capillary Refill: Capillary refill takes less than 2 seconds.  Neurological:     Mental Status: She is alert and oriented to person, place, and time.  Psychiatric:        Mood and Affect: Mood normal.        Behavior: Behavior normal.    Assessment/Plan: 1. Essential hypertension Improved, refill ordered.  - amLODipine (NORVASC) 5 MG tablet; TAKE 1 TABLET(5 MG) BY MOUTH DAILY   Dispense: 90 tablet; Refill: 1  2. Arthritis Arthritic pain is still bothering her, tizanidine ordered.  - tiZANidine (ZANAFLEX) 4 MG capsule; Take 1 capsule (4 mg total) by mouth 3 (three) times daily as needed for muscle spasms (and musculoskeletal pain).  Dispense: 90 capsule; Refill: 0  3. Generalized anxiety disorder Stable, alprazolam reordered.  - ALPRAZolam (XANAX) 0.25 MG tablet; Take 1 tablet (0.25 mg total) by mouth 2 (two) times daily as needed for anxiety.  Dispense: 60 tablet; Refill: 2  4. Gastroesophageal reflux disease without esophagitis Stable with current medications   5. Mixed hyperlipidemia Stable with current medications.    General Counseling: haeleigh streiff understanding of the findings of todays visit and agrees with  plan of treatment. I have discussed any further diagnostic evaluation that may be needed or ordered today. We also reviewed her medications today. she has been encouraged to call the office with any questions or concerns that should arise related to todays visit.    No orders of the defined types were placed in this encounter.   Meds ordered this encounter  Medications   amLODipine (NORVASC) 5 MG tablet    Sig: TAKE 1 TABLET(5 MG) BY MOUTH DAILY    Dispense:  90 tablet    Refill:  1   ALPRAZolam (XANAX) 0.25 MG tablet    Sig: Take 1 tablet (0.25 mg total) by mouth 2 (two) times daily as needed for anxiety.    Dispense:  60 tablet    Refill:  2   tiZANidine (ZANAFLEX) 4 MG capsule    Sig: Take 1 capsule (4 mg total) by mouth 3 (three) times daily as needed for muscle spasms (and musculoskeletal pain).    Dispense:  90 capsule    Refill:  0    Return for F/U previously scheduled appt in november.   Total time spent:30 Minutes Time spent includes review of chart, medications, test results, and follow up plan with the patient.   [place] Controlled Substance Database was reviewed by me.  This patient was seen by Sallyanne Kuster, FNP-C  in collaboration with Dr. Beverely Risen as a part of collaborative care agreement.   Zong Mcquarrie R. Tedd Sias, MSN, FNP-C Internal medicine

## 2020-09-05 ENCOUNTER — Other Ambulatory Visit: Payer: Self-pay | Admitting: Nurse Practitioner

## 2020-09-05 DIAGNOSIS — K219 Gastro-esophageal reflux disease without esophagitis: Secondary | ICD-10-CM

## 2020-09-10 ENCOUNTER — Other Ambulatory Visit: Payer: Self-pay | Admitting: Nurse Practitioner

## 2020-09-10 DIAGNOSIS — I10 Essential (primary) hypertension: Secondary | ICD-10-CM

## 2020-09-10 DIAGNOSIS — E782 Mixed hyperlipidemia: Secondary | ICD-10-CM

## 2020-12-11 ENCOUNTER — Other Ambulatory Visit: Payer: Self-pay | Admitting: Internal Medicine

## 2020-12-11 DIAGNOSIS — I10 Essential (primary) hypertension: Secondary | ICD-10-CM

## 2021-01-04 ENCOUNTER — Other Ambulatory Visit: Payer: Self-pay | Admitting: Nurse Practitioner

## 2021-01-04 DIAGNOSIS — I10 Essential (primary) hypertension: Secondary | ICD-10-CM

## 2021-01-09 ENCOUNTER — Ambulatory Visit (INDEPENDENT_AMBULATORY_CARE_PROVIDER_SITE_OTHER): Payer: 59 | Admitting: Nurse Practitioner

## 2021-01-09 ENCOUNTER — Encounter: Payer: Self-pay | Admitting: Nurse Practitioner

## 2021-01-09 ENCOUNTER — Other Ambulatory Visit: Payer: Self-pay

## 2021-01-09 VITALS — BP 137/74 | HR 70 | Temp 98.6°F | Resp 16 | Ht 65.0 in | Wt 211.0 lb

## 2021-01-09 DIAGNOSIS — E559 Vitamin D deficiency, unspecified: Secondary | ICD-10-CM

## 2021-01-09 DIAGNOSIS — Z0001 Encounter for general adult medical examination with abnormal findings: Secondary | ICD-10-CM | POA: Diagnosis not present

## 2021-01-09 DIAGNOSIS — K219 Gastro-esophageal reflux disease without esophagitis: Secondary | ICD-10-CM | POA: Diagnosis not present

## 2021-01-09 DIAGNOSIS — F411 Generalized anxiety disorder: Secondary | ICD-10-CM

## 2021-01-09 DIAGNOSIS — I10 Essential (primary) hypertension: Secondary | ICD-10-CM | POA: Diagnosis not present

## 2021-01-09 DIAGNOSIS — E782 Mixed hyperlipidemia: Secondary | ICD-10-CM

## 2021-01-09 DIAGNOSIS — Z1231 Encounter for screening mammogram for malignant neoplasm of breast: Secondary | ICD-10-CM

## 2021-01-09 DIAGNOSIS — R300 Dysuria: Secondary | ICD-10-CM

## 2021-01-09 MED ORDER — ALPRAZOLAM 0.25 MG PO TABS: 0.2500 mg | ORAL_TABLET | Freq: Two times a day (BID) | ORAL | 2 refills | Status: DC | PRN

## 2021-01-09 MED ORDER — VITAMIN D (ERGOCALCIFEROL) 1.25 MG (50000 UNIT) PO CAPS: 50000.0000 [IU] | ORAL_CAPSULE | ORAL | 5 refills | Status: DC

## 2021-01-09 MED ORDER — AMLODIPINE BESYLATE 5 MG PO TABS
5.0000 mg | ORAL_TABLET | Freq: Every day | ORAL | 0 refills | Status: DC
Start: 2021-01-09 — End: 2021-07-10

## 2021-01-09 MED ORDER — ATORVASTATIN CALCIUM 10 MG PO TABS: 10.0000 mg | ORAL_TABLET | Freq: Every evening | ORAL | 1 refills | Status: DC

## 2021-01-09 MED ORDER — OMEPRAZOLE 40 MG PO CPDR: 40.0000 mg | DELAYED_RELEASE_CAPSULE | Freq: Every day | ORAL | 1 refills | Status: DC

## 2021-01-09 NOTE — Progress Notes (Signed)
[facility] [address] [place], [place] [address]  Internal MEDICINE  Office Visit Note  Patient Name: Jamie Sanchez  [id number]  [id number]  Date of Service: 01/09/2021  Chief Complaint  Patient presents with   Annual Exam    Left ear feels stuffy   Gastroesophageal Reflux   Hyperlipidemia   Medication Refill    Jamie Sanchez presents for an annual well visit and physical exam. Jamie Sanchez is a well appearing 50 yo female. She has some chronic medical problems including hypertension, GERD, and hyperlipidemia. She is currently taking atenolol and amlodipine for hypertension and her blood pressure is well controlled. She takes omeprazole for GERD and her symptoms are controlled. She is on atorvastatin for elevated cholesterol.  She also takes alprazolam as needed for anxiety and a weekly vitamin D supplement for vitamin D deficiency.  Her previous office visit was in June and her arthritis was bothering her then. She is no longer taking meloxicam or tizanidine. She takes OTC tylenol or motrin as needed. The pain is manageable at this time.     Current Medication: Outpatient Encounter Medications as of 01/09/2021  Medication Sig   atenolol (TENORMIN) 25 MG tablet TAKE 1 TABLET(25 MG) BY MOUTH DAILY   [DISCONTINUED] ALPRAZolam (XANAX) 0.25 MG tablet Take 1 tablet (0.25 mg total) by mouth 2 (two) times daily as needed for anxiety.   [DISCONTINUED] amLODipine (NORVASC) 5 MG tablet TAKE 1 TABLET(5 MG) BY MOUTH DAILY   [DISCONTINUED] atorvastatin (LIPITOR) 10 MG tablet Take 1 tablet (10 mg total) by mouth every evening.   [DISCONTINUED] omeprazole (PRILOSEC) 40 MG capsule Take 1 capsule (40 mg total) by mouth daily.   [DISCONTINUED] Vitamin D, Ergocalciferol, (DRISDOL) 1.25 MG (50000 UNIT) CAPS capsule Take 1 capsule (50,000 Units total) by mouth every 7 (seven) days.   ALPRAZolam (XANAX) 0.25 MG tablet Take 1 tablet (0.25 mg total) by mouth 2 (two) times daily as needed for anxiety.    amLODipine (NORVASC) 5 MG tablet Take 1 tablet (5 mg total) by mouth daily.   atorvastatin (LIPITOR) 10 MG tablet Take 1 tablet (10 mg total) by mouth every evening.   omeprazole (PRILOSEC) 40 MG capsule Take 1 capsule (40 mg total) by mouth daily.   Vitamin D, Ergocalciferol, (DRISDOL) 1.25 MG (50000 UNIT) CAPS capsule Take 1 capsule (50,000 Units total) by mouth every 7 (seven) days.   [DISCONTINUED] ALPRAZolam (XANAX) 0.25 MG tablet Take 1 tablet (0.25 mg total) by mouth 2 (two) times daily as needed for anxiety. (Patient not taking: Reported on 01/09/2021)   [DISCONTINUED] meloxicam (MOBIC) 15 MG tablet Take 1 tablet (15 mg total) by mouth daily. (Patient not taking: Reported on 01/09/2021)   [DISCONTINUED] tiZANidine (ZANAFLEX) 4 MG capsule Take 1 capsule (4 mg total) by mouth 3 (three) times daily as needed for muscle spasms (and musculoskeletal pain). (Patient not taking: Reported on 01/09/2021)   No facility-administered encounter medications on file as of 01/09/2021.    Surgical History: Past Surgical History:  Procedure Laterality Date   ABDOMINAL HYSTERECTOMY     APPENDECTOMY     INCONTINENCE SURGERY     KNEE ARTHROSCOPY Left     Medical History: Past Medical History:  Diagnosis Date   GERD (gastroesophageal reflux disease)    Hyperlipidemia     Family History: Family History  Problem Relation Age of Onset   Heart attack Mother    Hypertension Father    Kidney cancer Father    Hypertension Sister  Heart attack Sister    Hypertension Brother    Heart attack Brother     Social History   Socioeconomic History   Marital status: Married    Spouse name: Not on file   Number of children: Not on file   Years of education: Not on file   Highest education level: Not on file  Occupational History   Not on file  Tobacco Use   Smoking status: Every Day    Packs/day: 0.50    Types: Cigarettes   Smokeless tobacco: Never  Vaping Use   Vaping Use: Never used   Substance and Sexual Activity   Alcohol use: Yes    Comment: occ   Drug use: Never   Sexual activity: Not on file  Other Topics Concern   Not on file  Social History Narrative   Not on file   Social Determinants of Health   Financial Resource Strain: Not on file  Food Insecurity: Not on file  Transportation Needs: Not on file  Physical Activity: Not on file  Stress: Not on file  Social Connections: Not on file  Intimate Partner Violence: Not on file      Review of Systems  Constitutional:  Negative for activity change, appetite change, chills, fatigue, fever and unexpected weight change.  HENT: Negative.  Negative for congestion, ear pain, rhinorrhea, sore throat and trouble swallowing.   Eyes: Negative.   Respiratory: Negative.  Negative for cough, chest tightness, shortness of breath and wheezing.   Cardiovascular: Negative.  Negative for chest pain.  Gastrointestinal: Negative.  Negative for abdominal pain, blood in stool, constipation, diarrhea, nausea and vomiting.  Endocrine: Negative.   Genitourinary: Negative.  Negative for difficulty urinating, dysuria, frequency, hematuria and urgency.  Musculoskeletal: Negative.  Negative for arthralgias, back pain, joint swelling, myalgias and neck pain.  Skin: Negative.  Negative for rash and wound.  Allergic/Immunologic: Negative.  Negative for immunocompromised state.  Neurological: Negative.  Negative for dizziness, seizures, numbness and headaches.  Hematological: Negative.   Psychiatric/Behavioral: Negative.  Negative for behavioral problems, self-injury and suicidal ideas. The patient is not nervous/anxious.    Vital Signs: BP 137/74   Pulse 70   Temp 98.6 F (37 C)   Resp 16   Ht 5\' 5"  (1.651 m)   Wt 211 lb (95.7 kg)   SpO2 97%   BMI 35.11 kg/m    Physical Exam Vitals reviewed.  Constitutional:      General: She is awake. She is not in acute distress.    Appearance: Normal appearance. She is well-developed  and well-groomed. She is obese. She is not ill-appearing or diaphoretic.  HENT:     Head: Normocephalic and atraumatic.     Right Ear: Tympanic membrane, ear canal and external ear normal.     Left Ear: Tympanic membrane, ear canal and external ear normal.     Nose: Nose normal. No congestion or rhinorrhea.     Mouth/Throat:     Lips: Pink.     Mouth: Mucous membranes are moist.     Pharynx: Oropharynx is clear. Uvula midline. No oropharyngeal exudate or posterior oropharyngeal erythema.  Eyes:     General: Lids are normal. Vision grossly intact. Gaze aligned appropriately. No scleral icterus.       Right eye: No discharge.        Left eye: No discharge.     Extraocular Movements: Extraocular movements intact.     Conjunctiva/sclera: Conjunctivae normal.     Pupils: Pupils  are equal, round, and reactive to light.     Funduscopic exam:    Right eye: Red reflex present.        Left eye: Red reflex present. Neck:     Thyroid: No thyromegaly.     Vascular: No JVD.     Trachea: Trachea and phonation normal. No tracheal deviation.  Cardiovascular:     Rate and Rhythm: Normal rate and regular rhythm.     Pulses: Normal pulses.     Heart sounds: Normal heart sounds, S1 normal and S2 normal. No murmur heard.   No friction rub. No gallop.  Pulmonary:     Effort: Pulmonary effort is normal. No accessory muscle usage or respiratory distress.     Breath sounds: Normal breath sounds and air entry. No stridor. No wheezing or rales.  Chest:     Chest wall: No tenderness.     Comments: Declined clinical breast exam, gets annual mammograms.  Abdominal:     General: Bowel sounds are normal. There is no distension.     Palpations: Abdomen is soft. There is no shifting dullness, fluid wave, mass or pulsatile mass.     Tenderness: There is no abdominal tenderness. There is no guarding or rebound.  Musculoskeletal:        General: No tenderness or deformity. Normal range of motion.     Cervical  back: Normal range of motion and neck supple.     Right lower leg: No edema.     Left lower leg: No edema.  Lymphadenopathy:     Cervical: No cervical adenopathy.  Skin:    General: Skin is warm and dry.     Capillary Refill: Capillary refill takes less than 2 seconds.     Coloration: Skin is not pale.     Findings: No erythema or rash.  Neurological:     Mental Status: She is alert and oriented to person, place, and time.     Cranial Nerves: No cranial nerve deficit.     Motor: No abnormal muscle tone.     Coordination: Coordination normal.     Gait: Gait normal.     Deep Tendon Reflexes: Reflexes are normal and symmetric.  Psychiatric:        Mood and Affect: Mood and affect normal.        Behavior: Behavior normal. Behavior is cooperative.        Thought Content: Thought content normal.        Judgment: Judgment normal.       Assessment/Plan: 1. Encounter for general adult medical examination with abnormal findings Age-appropriate preventive screenings and vaccinations discussed, annual physical exam completed. Routine labs for health maintenance previously ordered but have not been drawn. Patient reminded . PHM updated.   2. Essential hypertension Stable, continue as prescribed  - amLODipine (NORVASC) 5 MG tablet; Take 1 tablet (5 mg total) by mouth daily.  Dispense: 90 tablet; Refill: 0  3. Mixed hyperlipidemia Stable, continue as prescribed.  - atorvastatin (LIPITOR) 10 MG tablet; Take 1 tablet (10 mg total) by mouth every evening.  Dispense: 90 tablet; Refill: 1  4. Gastroesophageal reflux disease without esophagitis Stable, continue as prescribed  - omeprazole (PRILOSEC) 40 MG capsule; Take 1 capsule (40 mg total) by mouth daily.  Dispense: 90 capsule; Refill: 1  5. Vitamin D deficiency Chronic low vitamin D, continue supplement.  - Vitamin D, Ergocalciferol, (DRISDOL) 1.25 MG (50000 UNIT) CAPS capsule; Take 1 capsule (50,000 Units total) by mouth every  7 (seven)  days.  Dispense: 4 capsule; Refill: 5  6. Dysuria Routine urinalysis done - UA/M w/rflx Culture, Routine  7. Generalized anxiety disorder Stable, refill ordered.  - ALPRAZolam (XANAX) 0.25 MG tablet; Take 1 tablet (0.25 mg total) by mouth 2 (two) times daily as needed for anxiety.  Dispense: 60 tablet; Refill: 2  8. Encounter for screening mammogram for malignant neoplasm of breast Routine mammogram ordered.  - MM 3D SCREEN BREAST BILATERAL; Future       General Counseling: ensleigh capilla understanding of the findings of todays visit and agrees with plan of treatment. I have discussed any further diagnostic evaluation that may be needed or ordered today. We also reviewed her medications today. she has been encouraged to call the office with any questions or concerns that should arise related to todays visit.    Orders Placed This Encounter  Procedures   Microscopic Examination   MM 3D SCREEN BREAST BILATERAL   UA/M w/rflx Culture, Routine    Meds ordered this encounter  Medications   amLODipine (NORVASC) 5 MG tablet    Sig: Take 1 tablet (5 mg total) by mouth daily.    Dispense:  90 tablet    Refill:  0   ALPRAZolam (XANAX) 0.25 MG tablet    Sig: Take 1 tablet (0.25 mg total) by mouth 2 (two) times daily as needed for anxiety.    Dispense:  60 tablet    Refill:  2   atorvastatin (LIPITOR) 10 MG tablet    Sig: Take 1 tablet (10 mg total) by mouth every evening.    Dispense:  90 tablet    Refill:  1   omeprazole (PRILOSEC) 40 MG capsule    Sig: Take 1 capsule (40 mg total) by mouth daily.    Dispense:  90 capsule    Refill:  1   Vitamin D, Ergocalciferol, (DRISDOL) 1.25 MG (50000 UNIT) CAPS capsule    Sig: Take 1 capsule (50,000 Units total) by mouth every 7 (seven) days.    Dispense:  4 capsule    Refill:  5    Return in about 6 months (around 07/09/2021) for F/U, anxiety med refill, Saralee Bolick PCP.   Total time spent:30 Minutes Time spent includes review of  chart, medications, test results, and follow up plan with the patient.   [place] Controlled Substance Database was reviewed by me.  This patient was seen by Jonetta Osgood, FNP-C in collaboration with Dr. Clayborn Bigness as a part of collaborative care agreement.  Etsuko Dierolf R. Valetta Fuller, MSN, FNP-C Internal medicine

## 2021-01-10 LAB — UA/M W/RFLX CULTURE, ROUTINE
Bilirubin, UA: NEGATIVE
Glucose, UA: NEGATIVE
Ketones, UA: NEGATIVE
Leukocytes,UA: NEGATIVE
Nitrite, UA: NEGATIVE
Protein,UA: NEGATIVE
RBC, UA: NEGATIVE
Specific Gravity, UA: 1.009 (ref 1.005–1.030)
Urobilinogen, Ur: 0.2 mg/dL (ref 0.2–1.0)
pH, UA: 6 (ref 5.0–7.5)

## 2021-01-10 LAB — MICROSCOPIC EXAMINATION
Bacteria, UA: NONE SEEN
Casts: NONE SEEN /lpf
Epithelial Cells (non renal): NONE SEEN /hpf (ref 0–10)
RBC, Urine: NONE SEEN /hpf (ref 0–2)
WBC, UA: NONE SEEN /hpf (ref 0–5)

## 2021-02-05 ENCOUNTER — Encounter: Payer: Self-pay | Admitting: Nurse Practitioner

## 2021-04-05 ENCOUNTER — Other Ambulatory Visit: Payer: Self-pay | Admitting: Nurse Practitioner

## 2021-04-05 DIAGNOSIS — E782 Mixed hyperlipidemia: Secondary | ICD-10-CM

## 2021-07-07 ENCOUNTER — Other Ambulatory Visit: Payer: Self-pay | Admitting: Nurse Practitioner

## 2021-07-07 DIAGNOSIS — I10 Essential (primary) hypertension: Secondary | ICD-10-CM

## 2021-07-10 ENCOUNTER — Encounter: Payer: Self-pay | Admitting: Nurse Practitioner

## 2021-07-10 ENCOUNTER — Ambulatory Visit (INDEPENDENT_AMBULATORY_CARE_PROVIDER_SITE_OTHER): Payer: 59 | Admitting: Nurse Practitioner

## 2021-07-10 VITALS — BP 120/83 | HR 75 | Temp 98.3°F | Resp 16 | Ht 65.0 in | Wt 188.4 lb

## 2021-07-10 DIAGNOSIS — K219 Gastro-esophageal reflux disease without esophagitis: Secondary | ICD-10-CM | POA: Diagnosis not present

## 2021-07-10 DIAGNOSIS — I10 Essential (primary) hypertension: Secondary | ICD-10-CM

## 2021-07-10 DIAGNOSIS — E782 Mixed hyperlipidemia: Secondary | ICD-10-CM

## 2021-07-10 DIAGNOSIS — E538 Deficiency of other specified B group vitamins: Secondary | ICD-10-CM

## 2021-07-10 DIAGNOSIS — E559 Vitamin D deficiency, unspecified: Secondary | ICD-10-CM

## 2021-07-10 DIAGNOSIS — Z8249 Family history of ischemic heart disease and other diseases of the circulatory system: Secondary | ICD-10-CM

## 2021-07-10 DIAGNOSIS — Z79899 Other long term (current) drug therapy: Secondary | ICD-10-CM

## 2021-07-10 DIAGNOSIS — F411 Generalized anxiety disorder: Secondary | ICD-10-CM

## 2021-07-10 LAB — POCT URINE DRUG SCREEN
Methylenedioxyamphetamine: NOT DETECTED
POC Amphetamine UR: NOT DETECTED
POC BENZODIAZEPINES UR: POSITIVE — AB
POC Barbiturate UR: NOT DETECTED
POC Cocaine UR: NOT DETECTED
POC Ecstasy UR: NOT DETECTED
POC Marijuana UR: NOT DETECTED
POC Methadone UR: NOT DETECTED
POC Methamphetamine UR: NOT DETECTED
POC Opiate Ur: NOT DETECTED
POC Oxycodone UR: NOT DETECTED
POC PHENCYCLIDINE UR: NOT DETECTED
POC TRICYCLICS UR: POSITIVE — AB

## 2021-07-10 MED ORDER — MELOXICAM 15 MG PO TABS: 15.0000 mg | ORAL_TABLET | Freq: Every day | ORAL | 2 refills | Status: DC

## 2021-07-10 MED ORDER — ALPRAZOLAM 0.25 MG PO TABS: 0.2500 mg | ORAL_TABLET | Freq: Two times a day (BID) | ORAL | 2 refills | Status: DC | PRN

## 2021-07-10 MED ORDER — ATORVASTATIN CALCIUM 10 MG PO TABS: 10.0000 mg | ORAL_TABLET | Freq: Every day | ORAL | 1 refills | Status: DC

## 2021-07-10 MED ORDER — VITAMIN D (ERGOCALCIFEROL) 1.25 MG (50000 UNIT) PO CAPS: 50000.0000 [IU] | ORAL_CAPSULE | ORAL | 5 refills | Status: DC

## 2021-07-10 MED ORDER — AMLODIPINE BESYLATE 5 MG PO TABS
5.0000 mg | ORAL_TABLET | Freq: Every day | ORAL | 1 refills | Status: DC
Start: 2021-07-10 — End: 2021-09-05

## 2021-07-10 MED ORDER — OMEPRAZOLE 40 MG PO CPDR: 40.0000 mg | DELAYED_RELEASE_CAPSULE | Freq: Every day | ORAL | 1 refills | Status: DC

## 2021-07-10 NOTE — Progress Notes (Signed)
[facility] [place], [place] [address]  Internal MEDICINE  Office Visit Note  Patient Name: Jamie Sanchez  [id number]  [id number]  Date of Service: 07/10/2021  Chief Complaint  Patient presents with   Follow-up   Gastroesophageal Reflux   Hyperlipidemia   Medication Refill    HPI Eleah presents for follow-up visit for hyperlipidemia, anxiety and medication refills.  Her annual physical was in November last year and her routine lab work was ordered in May last year but she did not have it drawn.  So she is still due for routine labs She is due for refills of alprazolam.  She has not had a refill ordered since last November.  She does not use it every day and only uses it when she needs it. She has lost 23 pounds since November last year. Echo in 2021  Current Medication: Outpatient Encounter Medications as of 07/10/2021  Medication Sig   atenolol (TENORMIN) 25 MG tablet TAKE 1 TABLET(25 MG) BY MOUTH DAILY   meloxicam (MOBIC) 15 MG tablet Take 1 tablet (15 mg total) by mouth daily.   [DISCONTINUED] ALPRAZolam (XANAX) 0.25 MG tablet Take 1 tablet (0.25 mg total) by mouth 2 (two) times daily as needed for anxiety.   [DISCONTINUED] amLODipine (NORVASC) 5 MG tablet Take 1 tablet (5 mg total) by mouth daily.   [DISCONTINUED] atorvastatin (LIPITOR) 10 MG tablet TAKE 1 TABLET(10 MG) BY MOUTH EVERY EVENING   [DISCONTINUED] omeprazole (PRILOSEC) 40 MG capsule Take 1 capsule (40 mg total) by mouth daily.   [DISCONTINUED] Vitamin D, Ergocalciferol, (DRISDOL) 1.25 MG (50000 UNIT) CAPS capsule Take 1 capsule (50,000 Units total) by mouth every 7 (seven) days.   ALPRAZolam (XANAX) 0.25 MG tablet Take 1 tablet (0.25 mg total) by mouth 2 (two) times daily as needed for anxiety.   amLODipine (NORVASC) 5 MG tablet Take 1 tablet (5 mg total) by mouth daily.   atorvastatin (LIPITOR) 10 MG tablet Take 1 tablet (10 mg total) by mouth daily.   omeprazole (PRILOSEC) 40 MG capsule  Take 1 capsule (40 mg total) by mouth daily.   Vitamin D, Ergocalciferol, (DRISDOL) 1.25 MG (50000 UNIT) CAPS capsule Take 1 capsule (50,000 Units total) by mouth every 7 (seven) days.   [DISCONTINUED] atenolol (TENORMIN) 25 MG tablet TAKE 1 TABLET(25 MG) BY MOUTH DAILY   [DISCONTINUED] atorvastatin (LIPITOR) 10 MG tablet Take 1 tablet (10 mg total) by mouth every evening.   No facility-administered encounter medications on file as of 07/10/2021.    Surgical History: Past Surgical History:  Procedure Laterality Date   ABDOMINAL HYSTERECTOMY     APPENDECTOMY     INCONTINENCE SURGERY     KNEE ARTHROSCOPY Left     Medical History: Past Medical History:  Diagnosis Date   GERD (gastroesophageal reflux disease)    Hyperlipidemia     Family History: Family History  Problem Relation Age of Onset   Heart attack Mother    Hypertension Father    Kidney cancer Father    Hypertension Sister    Heart attack Sister    Hypertension Brother    Heart attack Brother     Social History   Socioeconomic History   Marital status: Married    Spouse name: Not on file   Number of children: Not on file   Years of education: Not on file   Highest education level: Not on file  Occupational History   Not on file  Tobacco Use   Smoking  status: Every Day    Packs/day: 0.50    Types: Cigarettes   Smokeless tobacco: Never  Vaping Use   Vaping Use: Never used  Substance and Sexual Activity   Alcohol use: Yes    Comment: occ   Drug use: Never   Sexual activity: Not on file  Other Topics Concern   Not on file  Social History Narrative   Not on file   Social Determinants of Health   Financial Resource Strain: Not on file  Food Insecurity: Not on file  Transportation Needs: Not on file  Physical Activity: Not on file  Stress: Not on file  Social Connections: Not on file  Intimate Partner Violence: Not on file      Review of Systems  Constitutional:  Negative for chills, fatigue  and unexpected weight change.  HENT:  Negative for congestion, rhinorrhea, sneezing and sore throat.   Eyes:  Negative for redness.  Respiratory:  Negative for cough, chest tightness and shortness of breath.   Cardiovascular:  Negative for chest pain and palpitations.  Gastrointestinal:  Negative for abdominal pain, constipation, diarrhea, nausea and vomiting.  Genitourinary:  Negative for dysuria and frequency.  Musculoskeletal:  Negative for arthralgias, back pain, joint swelling and neck pain.  Skin:  Negative for rash.  Neurological: Negative.  Negative for tremors and numbness.  Hematological:  Negative for adenopathy. Does not bruise/bleed easily.  Psychiatric/Behavioral:  Negative for behavioral problems (Depression), sleep disturbance and suicidal ideas. The patient is not nervous/anxious.     Vital Signs: BP 120/83   Pulse 75   Temp 98.3 F (36.8 C)   Resp 16   Ht 5' 5"  (1.651 m)   Wt 188 lb 6.4 oz (85.5 kg)   SpO2 96%   BMI 31.35 kg/m    Physical Exam Vitals reviewed.  Constitutional:      Appearance: Normal appearance. She is obese.  HENT:     Head: Normocephalic and atraumatic.  Cardiovascular:     Rate and Rhythm: Normal rate and regular rhythm.     Pulses: Normal pulses.     Heart sounds: Normal heart sounds. No murmur heard. Pulmonary:     Effort: Pulmonary effort is normal. No respiratory distress.     Breath sounds: Normal breath sounds.  Skin:    General: Skin is warm and dry.     Capillary Refill: Capillary refill takes less than 2 seconds.  Neurological:     Mental Status: She is alert and oriented to person, place, and time.  Psychiatric:        Mood and Affect: Mood normal.        Behavior: Behavior normal.        Assessment/Plan: 1. Essential hypertension Blood pressure well controlled with current medication, routine labs ordered and amlodipine refills ordered. - amLODipine (NORVASC) 5 MG tablet; Take 1 tablet (5 mg total) by mouth  daily.  Dispense: 90 tablet; Refill: 1 - CBC with Differential/Platelet - CMP14+EGFR  2. Mixed hyperlipidemia Continue atorvastatin as prescribed, refills ordered, routine labs ordered - atorvastatin (LIPITOR) 10 MG tablet; Take 1 tablet (10 mg total) by mouth daily.  Dispense: 90 tablet; Refill: 1 - CBC with Differential/Platelet - Lipid Profile - TSH + free T4  3. Gastroesophageal reflux disease without esophagitis Continue omeprazole as ordered, refills ordered.  Routine labs ordered - omeprazole (PRILOSEC) 40 MG capsule; Take 1 capsule (40 mg total) by mouth daily.  Dispense: 90 capsule; Refill: 1 - CBC with Differential/Platelet - CMP14+EGFR -  TSH + free T4  4. Vitamin D deficiency Routine labs ordered, prescription vitamin D supplement refills ordered. - Vitamin D, Ergocalciferol, (DRISDOL) 1.25 MG (50000 UNIT) CAPS capsule; Take 1 capsule (50,000 Units total) by mouth every 7 (seven) days.  Dispense: 4 capsule; Refill: 5 - CBC with Differential/Platelet - Vitamin D (25 hydroxy) - TSH + free T4  5. Family history of heart disease Routine labs ordered - CBC with Differential/Platelet - CMP14+EGFR - TSH + free T4  6. B12 deficiency Routine labs ordered - B12 and Folate Panel - TSH + free T4  7. Encounter for long-term (current) use of high-risk medication UDS positive for benzodiazepines which is consistent with her current prescription - POCT Urine Drug Screen - ALPRAZolam (XANAX) 0.25 MG tablet; Take 1 tablet (0.25 mg total) by mouth 2 (two) times daily as needed for anxiety.  Dispense: 60 tablet; Refill: 2  8. Generalized anxiety disorder Stable with current medication, she only takes alprazolam as needed and this is not every day - ALPRAZolam (XANAX) 0.25 MG tablet; Take 1 tablet (0.25 mg total) by mouth 2 (two) times daily as needed for anxiety.  Dispense: 60 tablet; Refill: 2   General Counseling: glendell schlottman understanding of the findings of todays visit  and agrees with plan of treatment. I have discussed any further diagnostic evaluation that may be needed or ordered today. We also reviewed her medications today. she has been encouraged to call the office with any questions or concerns that should arise related to todays visit.    Orders Placed This Encounter  Procedures   CBC with Differential/Platelet   CMP14+EGFR   Lipid Profile   Vitamin D (25 hydroxy)   B12 and Folate Panel   TSH + free T4   POCT Urine Drug Screen    Meds ordered this encounter  Medications   ALPRAZolam (XANAX) 0.25 MG tablet    Sig: Take 1 tablet (0.25 mg total) by mouth 2 (two) times daily as needed for anxiety.    Dispense:  60 tablet    Refill:  2   meloxicam (MOBIC) 15 MG tablet    Sig: Take 1 tablet (15 mg total) by mouth daily.    Dispense:  30 tablet    Refill:  2   atorvastatin (LIPITOR) 10 MG tablet    Sig: Take 1 tablet (10 mg total) by mouth daily.    Dispense:  90 tablet    Refill:  1   omeprazole (PRILOSEC) 40 MG capsule    Sig: Take 1 capsule (40 mg total) by mouth daily.    Dispense:  90 capsule    Refill:  1   amLODipine (NORVASC) 5 MG tablet    Sig: Take 1 tablet (5 mg total) by mouth daily.    Dispense:  90 tablet    Refill:  1   Vitamin D, Ergocalciferol, (DRISDOL) 1.25 MG (50000 UNIT) CAPS capsule    Sig: Take 1 capsule (50,000 Units total) by mouth every 7 (seven) days.    Dispense:  4 capsule    Refill:  5    Return for previously scheduled, CPE, Charlize Hathaway PCP in November.   Total time spent: 30 minutes Time spent includes review of chart, medications, test results, and follow up plan with the patient.   [place] Controlled Substance Database was reviewed by me.  This patient was seen by Jonetta Osgood, FNP-C in collaboration with Dr. Clayborn Bigness as a part of collaborative care agreement.   Jaydence Vanyo R.  Valetta Fuller, MSN, FNP-C Internal medicine

## 2021-08-13 ENCOUNTER — Ambulatory Visit
Admission: RE | Admit: 2021-08-13 | Discharge: 2021-08-13 | Disposition: A | Discharge status: Home / Self Care | Payer: 59 | Source: Ambulatory Visit | Attending: Nurse Practitioner | Admitting: Nurse Practitioner

## 2021-08-13 DIAGNOSIS — Z1231 Encounter for screening mammogram for malignant neoplasm of breast: Secondary | ICD-10-CM | POA: Insufficient documentation

## 2021-08-29 ENCOUNTER — Encounter: Payer: Self-pay | Admitting: Nurse Practitioner

## 2021-09-05 ENCOUNTER — Other Ambulatory Visit: Payer: Self-pay | Admitting: Nurse Practitioner

## 2021-09-05 DIAGNOSIS — I10 Essential (primary) hypertension: Secondary | ICD-10-CM

## 2021-09-27 ENCOUNTER — Other Ambulatory Visit: Payer: Self-pay | Admitting: Nurse Practitioner

## 2021-10-09 ENCOUNTER — Other Ambulatory Visit: Payer: Self-pay | Admitting: Nurse Practitioner

## 2021-10-09 DIAGNOSIS — K219 Gastro-esophageal reflux disease without esophagitis: Secondary | ICD-10-CM

## 2021-12-25 ENCOUNTER — Telehealth: Payer: Self-pay | Admitting: Podiatry

## 2021-12-25 ENCOUNTER — Encounter: Payer: BLUE CROSS/BLUE SHIELD | Admitting: Nurse Practitioner

## 2021-12-25 NOTE — Telephone Encounter (Signed)
Pt called and wanted to get scheduled to see Dr Al Corpus and upon checking her insurance [employer]  is out of network. So pt decided she did not want to come in.  She then called back and said she had a care to credit card and really wanted to see Dr Al Corpus and I asked pt if that was like a care credit if so we do not accept care credit in our office either. She said was upset that she can use the card at a pharmacy but not at our office and I did apologize

## 2022-01-01 ENCOUNTER — Encounter: Payer: Self-pay | Admitting: Nurse Practitioner

## 2022-01-01 ENCOUNTER — Ambulatory Visit
Admission: RE | Admit: 2022-01-01 | Discharge: 2022-01-01 | Disposition: A | Discharge status: Home / Self Care | Payer: BLUE CROSS/BLUE SHIELD | Source: Ambulatory Visit | Attending: Nurse Practitioner | Admitting: Nurse Practitioner

## 2022-01-01 ENCOUNTER — Ambulatory Visit (INDEPENDENT_AMBULATORY_CARE_PROVIDER_SITE_OTHER): Payer: BLUE CROSS/BLUE SHIELD | Admitting: Nurse Practitioner

## 2022-01-01 ENCOUNTER — Ambulatory Visit
Admission: RE | Admit: 2022-01-01 | Discharge: 2022-01-01 | Disposition: A | Discharge status: Home / Self Care | Payer: BLUE CROSS/BLUE SHIELD | Attending: Nurse Practitioner | Admitting: Nurse Practitioner

## 2022-01-01 VITALS — BP 128/88 | HR 75 | Temp 98.0°F | Resp 16 | Ht 65.0 in | Wt 202.6 lb

## 2022-01-01 DIAGNOSIS — Z760 Encounter for issue of repeat prescription: Secondary | ICD-10-CM

## 2022-01-01 DIAGNOSIS — Z9181 History of falling: Secondary | ICD-10-CM

## 2022-01-01 DIAGNOSIS — F411 Generalized anxiety disorder: Secondary | ICD-10-CM

## 2022-01-01 DIAGNOSIS — I10 Essential (primary) hypertension: Secondary | ICD-10-CM

## 2022-01-01 DIAGNOSIS — S99921A Unspecified injury of right foot, initial encounter: Secondary | ICD-10-CM

## 2022-01-01 DIAGNOSIS — E559 Vitamin D deficiency, unspecified: Secondary | ICD-10-CM

## 2022-01-01 DIAGNOSIS — M79671 Pain in right foot: Secondary | ICD-10-CM

## 2022-01-01 DIAGNOSIS — E782 Mixed hyperlipidemia: Secondary | ICD-10-CM

## 2022-01-01 DIAGNOSIS — M25471 Effusion, right ankle: Secondary | ICD-10-CM | POA: Insufficient documentation

## 2022-01-01 DIAGNOSIS — K219 Gastro-esophageal reflux disease without esophagitis: Secondary | ICD-10-CM

## 2022-01-01 DIAGNOSIS — Z79899 Other long term (current) drug therapy: Secondary | ICD-10-CM

## 2022-01-01 MED ORDER — OMEPRAZOLE 40 MG PO CPDR: DELAYED_RELEASE_CAPSULE | ORAL | 1 refills | Status: DC

## 2022-01-01 MED ORDER — ATENOLOL 25 MG PO TABS: ORAL_TABLET | ORAL | 1 refills | Status: DC

## 2022-01-01 MED ORDER — MELOXICAM 15 MG PO TABS: ORAL_TABLET | ORAL | 1 refills | Status: DC

## 2022-01-01 MED ORDER — VITAMIN D (ERGOCALCIFEROL) 1.25 MG (50000 UNIT) PO CAPS: 50000.0000 [IU] | ORAL_CAPSULE | ORAL | 5 refills | Status: DC

## 2022-01-01 MED ORDER — ALPRAZOLAM 0.25 MG PO TABS: 0.2500 mg | ORAL_TABLET | Freq: Two times a day (BID) | ORAL | 2 refills | Status: DC | PRN

## 2022-01-01 MED ORDER — AMLODIPINE BESYLATE 5 MG PO TABS: ORAL_TABLET | ORAL | 1 refills | Status: DC

## 2022-01-01 MED ORDER — ATORVASTATIN CALCIUM 10 MG PO TABS: 10.0000 mg | ORAL_TABLET | Freq: Every day | ORAL | 1 refills | Status: DC

## 2022-01-01 MED ORDER — HYDROCODONE-ACETAMINOPHEN 5-325 MG PO TABS: 1.0000 | ORAL_TABLET | Freq: Four times a day (QID) | ORAL | 0 refills | Status: AC | PRN

## 2022-01-01 NOTE — Progress Notes (Signed)
[facility] [place], [place] [address]  Internal MEDICINE  Office Visit Note  Patient Name: Jamie Sanchez  [id number]  [id number]  Date of Service: 01/01/2022  Chief Complaint  Patient presents with   Acute Visit    [address] and right foot injury 1 week ago.     HPI Jamie Sanchez presents for an acute sick visit for fell on marble floor after walking downsome steps.  Injured right foot and ankle with bruising and swelling. Still bruised and swollen a week later.  Has not had any imaging.      Current Medication:  Outpatient Encounter Medications as of 01/01/2022  Medication Sig   [EXPIRED] HYDROcodone-acetaminophen (NORCO/VICODIN) 5-325 MG tablet Take 1 tablet by mouth every 6 (six) hours as needed for up to 5 days for moderate pain or severe pain.   [DISCONTINUED] ALPRAZolam (XANAX) 0.25 MG tablet Take 1 tablet (0.25 mg total) by mouth 2 (two) times daily as needed for anxiety.   [DISCONTINUED] amLODipine (NORVASC) 5 MG tablet TAKE 1 TABLET(5 MG) BY MOUTH DAILY   [DISCONTINUED] atenolol (TENORMIN) 25 MG tablet TAKE 1 TABLET(25 MG) BY MOUTH DAILY   [DISCONTINUED] atorvastatin (LIPITOR) 10 MG tablet Take 1 tablet (10 mg total) by mouth daily.   [DISCONTINUED] meloxicam (MOBIC) 15 MG tablet TAKE 1 TABLET(15 MG) BY MOUTH DAILY   [DISCONTINUED] omeprazole (PRILOSEC) 40 MG capsule TAKE 1 CAPSULE(40 MG) BY MOUTH DAILY   [DISCONTINUED] Vitamin D, Ergocalciferol, (DRISDOL) 1.25 MG (50000 UNIT) CAPS capsule Take 1 capsule (50,000 Units total) by mouth every 7 (seven) days.   ALPRAZolam (XANAX) 0.25 MG tablet Take 1 tablet (0.25 mg total) by mouth 2 (two) times daily as needed for anxiety.   amLODipine (NORVASC) 5 MG tablet TAKE 1 TABLET(5 MG) BY MOUTH DAILY   atenolol (TENORMIN) 25 MG tablet TAKE 1 TABLET(25 MG) BY MOUTH DAILY   atorvastatin (LIPITOR) 10 MG tablet Take 1 tablet (10 mg total) by mouth daily.   meloxicam (MOBIC) 15 MG tablet TAKE 1 TABLET(15 MG) BY MOUTH  DAILY   omeprazole (PRILOSEC) 40 MG capsule TAKE 1 CAPSULE(40 MG) BY MOUTH DAILY   Vitamin D, Ergocalciferol, (DRISDOL) 1.25 MG (50000 UNIT) CAPS capsule Take 1 capsule (50,000 Units total) by mouth every 7 (seven) days.   No facility-administered encounter medications on file as of 01/01/2022.      Medical History: Past Medical History:  Diagnosis Date   GERD (gastroesophageal reflux disease)    Hyperlipidemia      Vital Signs: BP 128/88   Pulse 75   Temp 98 F (36.7 C)   Resp 16   Ht 5\' 5"  (1.651 m)   Wt 202 lb 9.6 oz (91.9 kg)   SpO2 98%   BMI 33.71 kg/m    Review of Systems  Constitutional:  Negative for chills, fatigue and unexpected weight change.  HENT:  Negative for congestion, rhinorrhea, sneezing and sore throat.   Eyes:  Negative for redness.  Respiratory:  Negative for cough, chest tightness and shortness of breath.   Cardiovascular:  Negative for chest pain and palpitations.  Gastrointestinal:  Negative for abdominal pain, constipation, diarrhea, nausea and vomiting.  Genitourinary:  Negative for dysuria and frequency.  Musculoskeletal:  Positive for arthralgias and joint swelling. Negative for back pain and neck pain.       Bruising of right ankle and foot, pain and swelling. Difficulty bearing weight.   Skin:  Negative for rash.  Neurological: Negative.  Negative for tremors and  numbness.  Hematological:  Negative for adenopathy. Does not bruise/bleed easily.  Psychiatric/Behavioral:  Negative for behavioral problems (Depression), sleep disturbance and suicidal ideas. The patient is not nervous/anxious.     Physical Exam Vitals reviewed.  Constitutional:      Appearance: Normal appearance. Jamie Sanchez is obese.  HENT:     Head: Normocephalic and atraumatic.  Cardiovascular:     Rate and Rhythm: Normal rate and regular rhythm.     Pulses: Normal pulses.     Heart sounds: Normal heart sounds. No murmur heard. Pulmonary:     Effort: Pulmonary effort is  normal. No respiratory distress.     Breath sounds: Normal breath sounds.  Musculoskeletal:     Right ankle: Swelling and ecchymosis present. Tenderness present. Decreased range of motion.     Right Achilles Tendon: Normal.     Right foot: Decreased range of motion. Swelling (brusing), tenderness and bony tenderness present.  Skin:    General: Skin is warm and dry.     Capillary Refill: Capillary refill takes less than 2 seconds.  Neurological:     Mental Status: Jamie Sanchez is alert and oriented to person, place, and time.  Psychiatric:        Mood and Affect: Mood normal.        Behavior: Behavior normal.       Assessment/Plan: 1. Acute pain of right foot Xrays ordered, pain medication prescribed. Will most likely need ortho referral  - DG Ankle Complete Right; Future - DG Foot Complete Right; Future - HYDROcodone-acetaminophen (NORCO/VICODIN) 5-325 MG tablet; Take 1 tablet by mouth every 6 (six) hours as needed for up to 5 days for moderate pain or severe pain.  Dispense: 20 tablet; Refill: 0  2. Right ankle swelling See problem #1 - DG Ankle Complete Right; Future - DG Foot Complete Right; Future  3. History of recent fall Recent fall, concern for fracture, xrays ordered - DG Ankle Complete Right; Future - DG Foot Complete Right; Future  4. Injury of right foot, initial encounter Concern for fractures, xraus ordered  - DG Ankle Complete Right; Future - DG Foot Complete Right; Future  5. Medication refill - ALPRAZolam (XANAX) 0.25 MG tablet; Take 1 tablet (0.25 mg total) by mouth 2 (two) times daily as needed for anxiety.  Dispense: 60 tablet; Refill: 2 - amLODipine (NORVASC) 5 MG tablet; TAKE 1 TABLET(5 MG) BY MOUTH DAILY  Dispense: 90 tablet; Refill: 1 - atenolol (TENORMIN) 25 MG tablet; TAKE 1 TABLET(25 MG) BY MOUTH DAILY  Dispense: 90 tablet; Refill: 1 - atorvastatin (LIPITOR) 10 MG tablet; Take 1 tablet (10 mg total) by mouth daily.  Dispense: 90 tablet; Refill: 1   -  meloxicam (MOBIC) 15 MG tablet; TAKE 1 TABLET(15 MG) BY MOUTH DAILY  Dispense: 90 tablet; Refill: 1 - omeprazole (PRILOSEC) 40 MG capsule; TAKE 1 CAPSULE(40 MG) BY MOUTH DAILY  Dispense: 90 capsule; Refill: 1 - Vitamin D, Ergocalciferol, (DRISDOL) 1.25 MG (50000 UNIT) CAPS capsule; Take 1 capsule (50,000 Units total) by mouth every 7 (seven) days.  Dispense: 4 capsule; Refill: 5   General Counseling: song myre understanding of the findings of todays visit and agrees with plan of treatment. I have discussed any further diagnostic evaluation that may be needed or ordered today. We also reviewed her medications today. Jamie Sanchez has been encouraged to call the office with any questions or concerns that should arise related to todays visit.    Counseling:    Orders Placed This Encounter  Procedures  DG Ankle Complete Right   DG Foot Complete Right    Meds ordered this encounter  Medications   HYDROcodone-acetaminophen (NORCO/VICODIN) 5-325 MG tablet    Sig: Take 1 tablet by mouth every 6 (six) hours as needed for up to 5 days for moderate pain or severe pain.    Dispense:  20 tablet    Refill:  0    New script, fill today   ALPRAZolam (XANAX) 0.25 MG tablet    Sig: Take 1 tablet (0.25 mg total) by mouth 2 (two) times daily as needed for anxiety.    Dispense:  60 tablet    Refill:  2   amLODipine (NORVASC) 5 MG tablet    Sig: TAKE 1 TABLET(5 MG) BY MOUTH DAILY    Dispense:  90 tablet    Refill:  1   atenolol (TENORMIN) 25 MG tablet    Sig: TAKE 1 TABLET(25 MG) BY MOUTH DAILY    Dispense:  90 tablet    Refill:  1   atorvastatin (LIPITOR) 10 MG tablet    Sig: Take 1 tablet (10 mg total) by mouth daily.    Dispense:  90 tablet    Refill:  1   meloxicam (MOBIC) 15 MG tablet    Sig: TAKE 1 TABLET(15 MG) BY MOUTH DAILY    Dispense:  90 tablet    Refill:  1   omeprazole (PRILOSEC) 40 MG capsule    Sig: TAKE 1 CAPSULE(40 MG) BY MOUTH DAILY    Dispense:  90 capsule    Refill:  1    Vitamin D, Ergocalciferol, (DRISDOL) 1.25 MG (50000 UNIT) CAPS capsule    Sig: Take 1 capsule (50,000 Units total) by mouth every 7 (seven) days.    Dispense:  4 capsule    Refill:  5    Return if symptoms worsen or fail to improve.  [place] Controlled Substance Database was reviewed by me for overdose risk score (ORS)  Time spent:30 Minutes Time spent with patient included reviewing progress notes, labs, imaging studies, and discussing plan for follow up.   This patient was seen by Jonetta Osgood, FNP-C in collaboration with Dr. Clayborn Bigness as a part of collaborative care agreement.  Ileana Chalupa R. Valetta Fuller, MSN, FNP-C Internal Medicine

## 2022-01-03 ENCOUNTER — Telehealth: Payer: Self-pay | Admitting: Nurse Practitioner

## 2022-01-03 DIAGNOSIS — M25471 Effusion, right ankle: Secondary | ICD-10-CM

## 2022-01-03 DIAGNOSIS — S92001A Unspecified fracture of right calcaneus, initial encounter for closed fracture: Secondary | ICD-10-CM

## 2022-01-03 DIAGNOSIS — S92251A Displaced fracture of navicular [scaphoid] of right foot, initial encounter for closed fracture: Secondary | ICD-10-CM

## 2022-01-03 NOTE — Telephone Encounter (Signed)
I called Mrs. Jamie Sanchez and discussed her right ankle and right foot xrays with her.  There is a right ankle joint effusion and in the right foot there are multiple tiny avulsion fractures of the medial navicular and anterolateral calcaneus.

## 2022-01-07 ENCOUNTER — Encounter: Payer: Self-pay | Admitting: Nurse Practitioner

## 2022-01-07 ENCOUNTER — Telehealth: Payer: Self-pay | Admitting: Nurse Practitioner

## 2022-01-07 NOTE — Telephone Encounter (Signed)
Awaiting 01/01/22 office notes for Urgent Ortho referral-Toni

## 2022-01-17 ENCOUNTER — Encounter: Payer: Self-pay | Admitting: Nurse Practitioner

## 2022-01-23 LAB — VITAMIN D 25 HYDROXY (VIT D DEFICIENCY, FRACTURES): Vit D, 25-Hydroxy: 36.5 ng/mL (ref 30.0–100.0)

## 2022-01-23 LAB — CMP14+EGFR
ALT: 25 IU/L (ref 0–32)
AST: 22 IU/L (ref 0–40)
Albumin/Globulin Ratio: 1.8 (ref 1.2–2.2)
Albumin: 4.2 g/dL (ref 3.8–4.9)
Alkaline Phosphatase: 73 IU/L (ref 44–121)
BUN/Creatinine Ratio: 13 (ref 9–23)
BUN: 9 mg/dL (ref 6–24)
Bilirubin Total: 0.3 mg/dL (ref 0.0–1.2)
CO2: 22 mmol/L (ref 20–29)
Calcium: 9.3 mg/dL (ref 8.7–10.2)
Chloride: 102 mmol/L (ref 96–106)
Creatinine, Ser: 0.68 mg/dL (ref 0.57–1.00)
Globulin, Total: 2.3 g/dL (ref 1.5–4.5)
Glucose: 124 mg/dL — ABNORMAL HIGH (ref 70–99)
Potassium: 4.4 mmol/L (ref 3.5–5.2)
Sodium: 140 mmol/L (ref 134–144)
Total Protein: 6.5 g/dL (ref 6.0–8.5)
eGFR: 104 mL/min/{1.73_m2} (ref >59)

## 2022-01-23 LAB — CBC WITH DIFFERENTIAL/PLATELET
Basophils Absolute: 0.1 10*3/uL (ref 0.0–0.2)
Basos: 1 %
EOS (ABSOLUTE): 0.2 10*3/uL (ref 0.0–0.4)
Eos: 2 %
Hematocrit: 44.1 % (ref 34.0–46.6)
Hemoglobin: 15.3 g/dL (ref 11.1–15.9)
Immature Grans (Abs): 0 10*3/uL (ref 0.0–0.1)
Immature Granulocytes: 0 %
Lymphocytes Absolute: 3.2 10*3/uL — ABNORMAL HIGH (ref 0.7–3.1)
Lymphs: 34 %
MCH: 33.3 pg — ABNORMAL HIGH (ref 26.6–33.0)
MCHC: 34.7 g/dL (ref 31.5–35.7)
MCV: 96 fL (ref 79–97)
Monocytes Absolute: 0.7 10*3/uL (ref 0.1–0.9)
Monocytes: 7 %
Neutrophils Absolute: 5.3 10*3/uL (ref 1.4–7.0)
Neutrophils: 56 %
Platelets: 369 10*3/uL (ref 150–450)
RBC: 4.6 x10E6/uL (ref 3.77–5.28)
RDW: 12.1 % (ref 11.7–15.4)
WBC: 9.5 10*3/uL (ref 3.4–10.8)

## 2022-01-23 LAB — TSH+FREE T4
Free T4: 0.99 ng/dL (ref 0.82–1.77)
TSH: 3.23 u[IU]/mL (ref 0.450–4.500)

## 2022-01-23 LAB — LIPID PANEL
Chol/HDL Ratio: 4.4 ratio (ref 0.0–4.4)
Cholesterol, Total: 151 mg/dL (ref 100–199)
HDL: 34 mg/dL — ABNORMAL LOW (ref >39)
LDL Chol Calc (NIH): 69 mg/dL (ref 0–99)
Triglycerides: 298 mg/dL — ABNORMAL HIGH (ref 0–149)
VLDL Cholesterol Cal: 48 mg/dL — ABNORMAL HIGH (ref 5–40)

## 2022-01-23 LAB — B12 AND FOLATE PANEL
Folate: 4.2 ng/mL (ref >3.0)
Vitamin B-12: 477 pg/mL (ref 232–1245)

## 2022-01-24 NOTE — Progress Notes (Signed)
Will discuss results at next visit.

## 2022-01-28 IMAGING — MG DIGITAL SCREENING BILAT W/ TOMO W/ CAD
8 series · 8 of 24 positions shown · non-contrast
Comparison: Previous exam(s).

CLINICAL DATA: Screening.

EXAM:
DIGITAL SCREENING BILATERAL MAMMOGRAM WITH TOMO AND CAD

[R CC synth-2D]
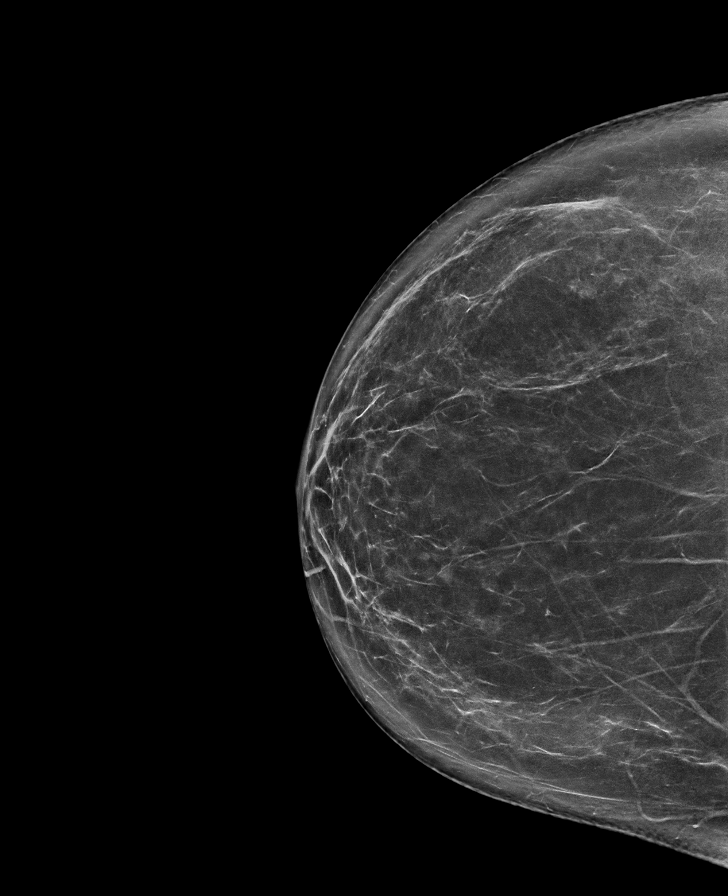

[L CC synth-2D]
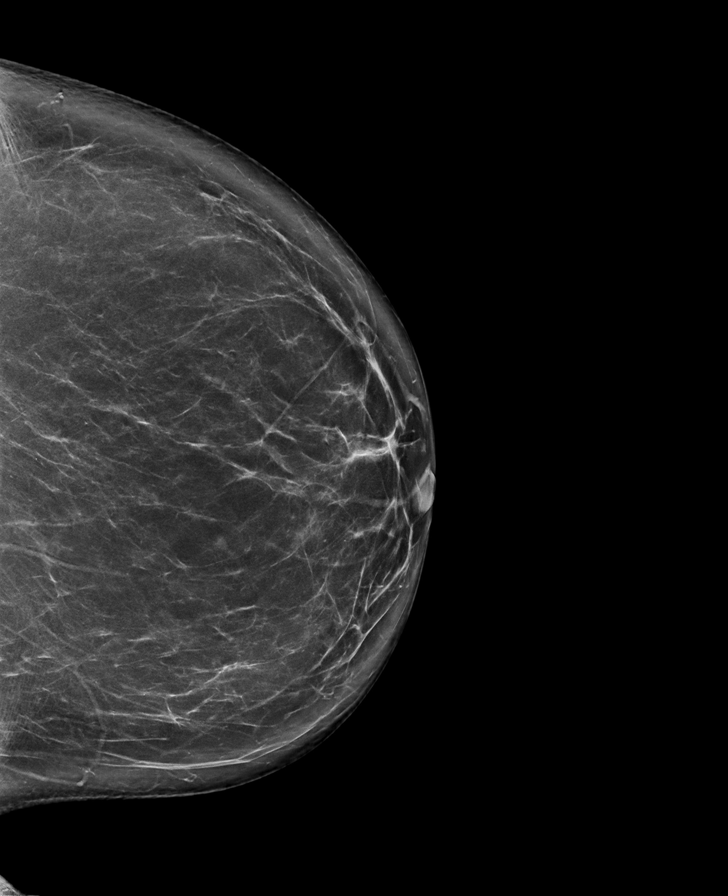

[L MLO synth-2D]
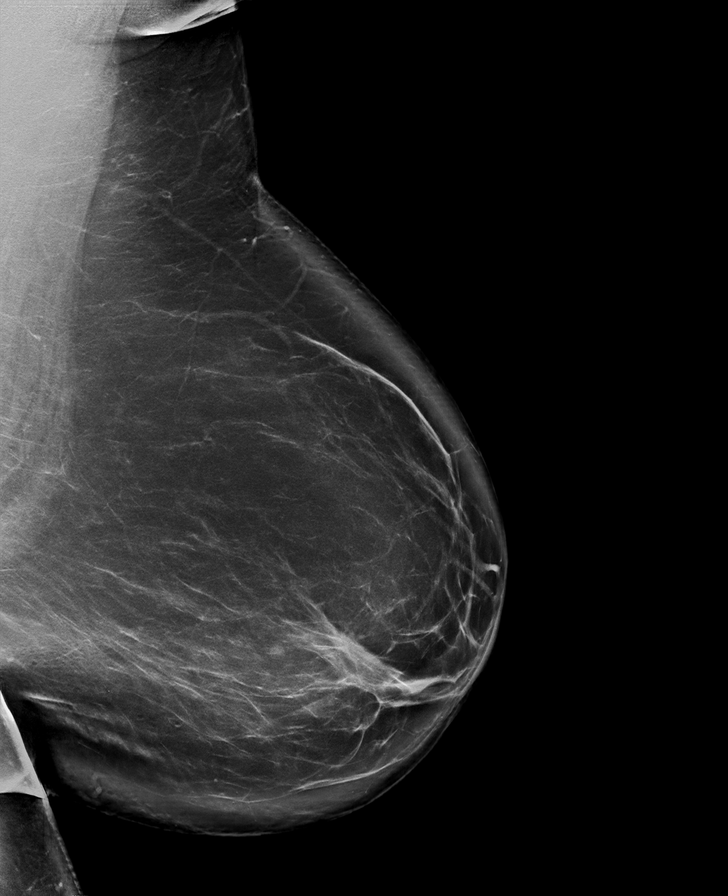

[R MLO synth-2D]
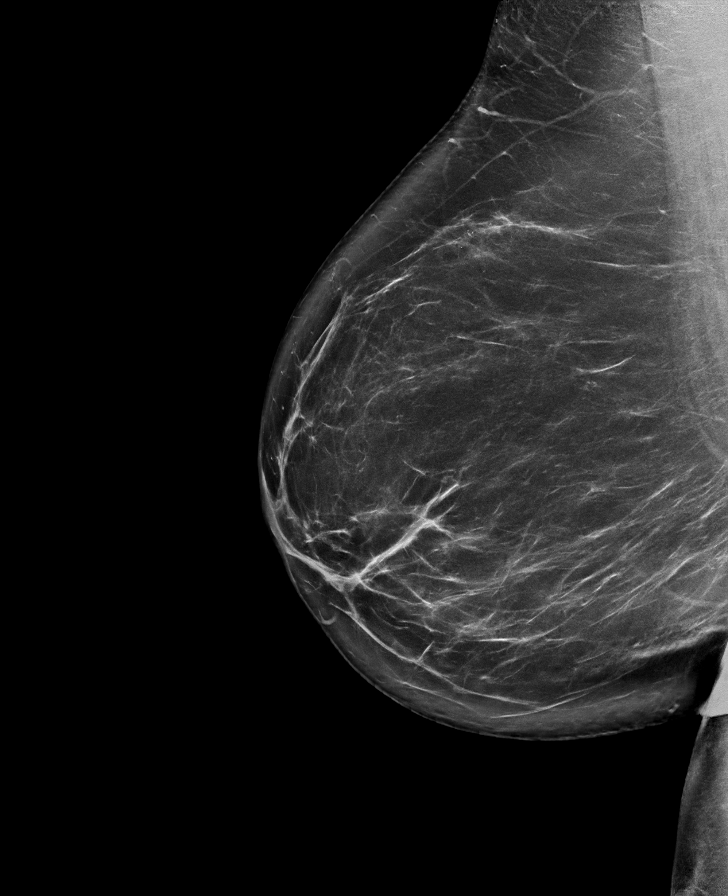

[L CC tomo · tomo slice 47/94.0]
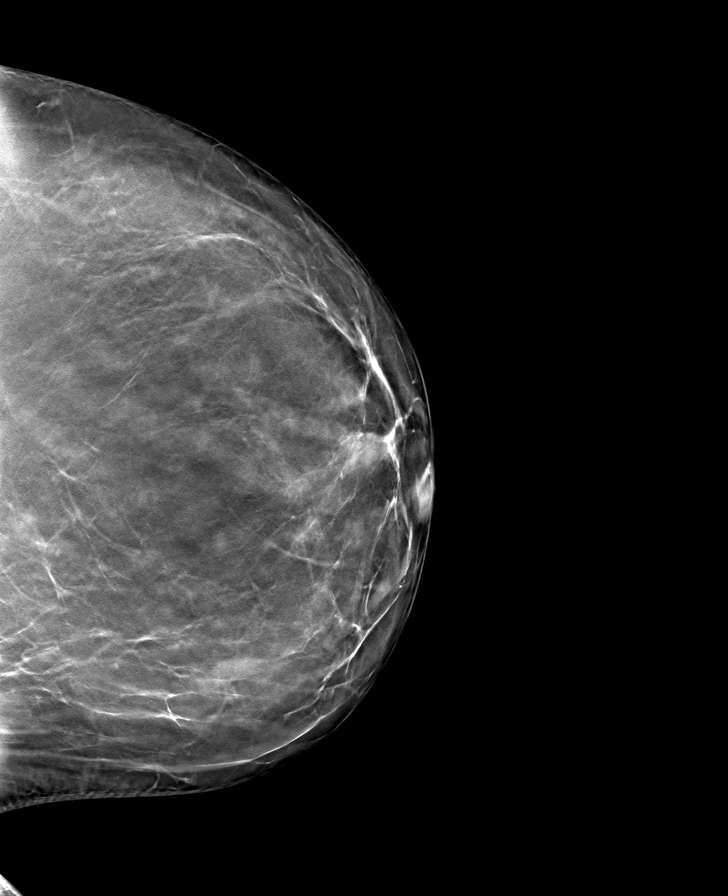

[L MLO tomo · tomo slice 57/112.0]
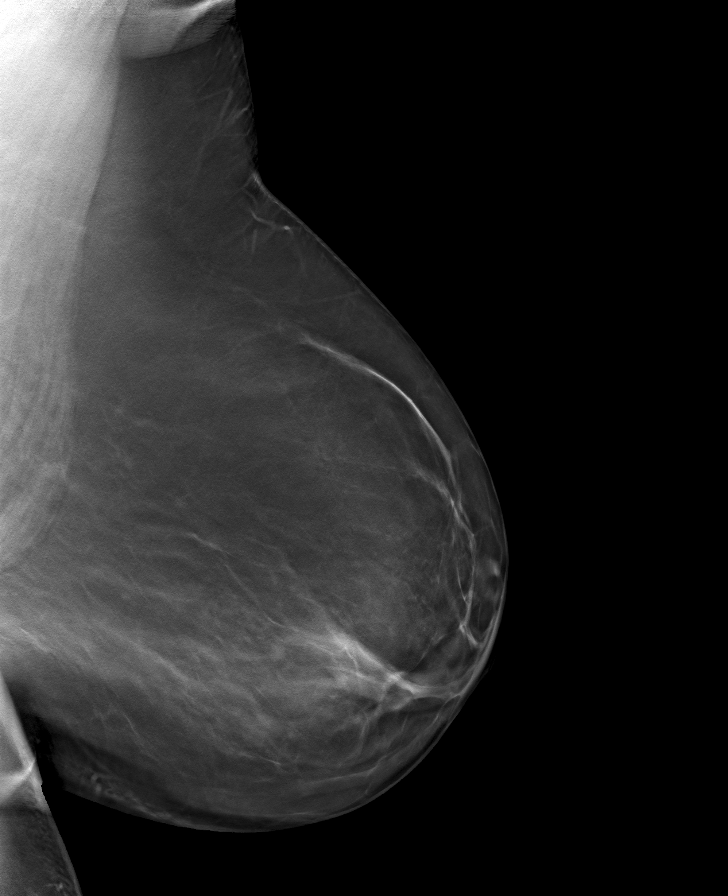

[R CC tomo · tomo slice 43/85.0]
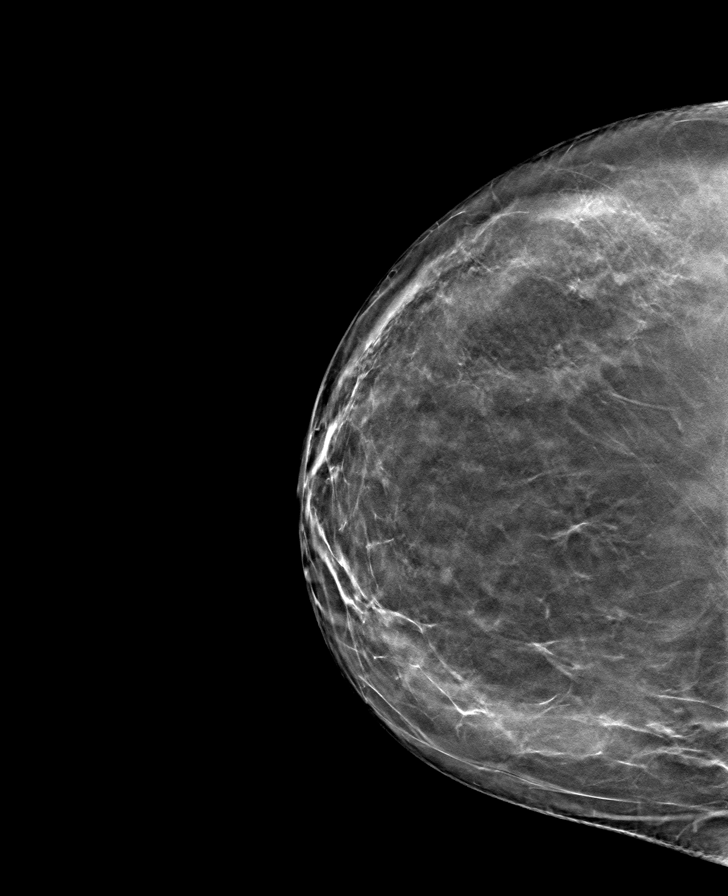

[R MLO tomo · tomo slice 48/95.0]
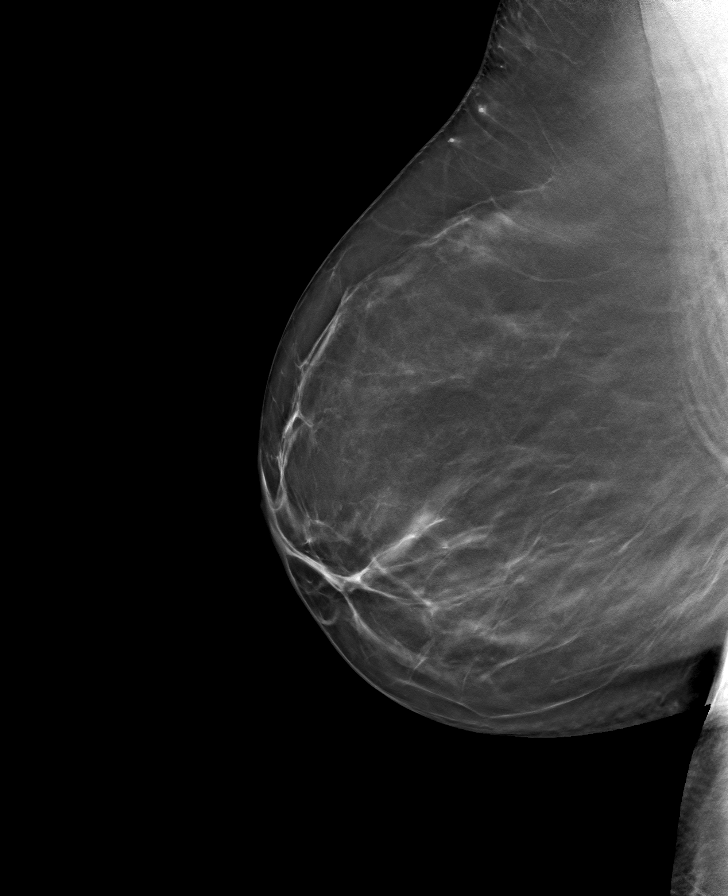

[8 of 24 positions shown; findings below may reference images not displayed]

ACR Breast Density Category b: There are scattered areas of
fibroglandular density.
FINDINGS: There are no findings suspicious for malignancy. Images were
processed with CAD.
IMPRESSION: No mammographic evidence of malignancy. A result letter of this
screening mammogram will be mailed directly to the patient.

RECOMMENDATION:
Screening mammogram in one year. (Code:[id number])

BI-RADS CATEGORY  1: Negative.

## 2022-02-05 ENCOUNTER — Other Ambulatory Visit: Payer: Self-pay | Admitting: Nurse Practitioner

## 2022-02-05 DIAGNOSIS — E782 Mixed hyperlipidemia: Secondary | ICD-10-CM

## 2022-02-06 ENCOUNTER — Telehealth: Payer: Self-pay | Admitting: Nurse Practitioner

## 2022-02-06 NOTE — Telephone Encounter (Signed)
Orthopedic referral sent via [facility] to [facility]-Toni 

## 2022-02-12 ENCOUNTER — Encounter: Payer: Self-pay | Admitting: Nurse Practitioner

## 2022-02-14 ENCOUNTER — Telehealth: Payer: Self-pay | Admitting: Nurse Practitioner

## 2022-02-14 NOTE — Telephone Encounter (Signed)
Per Marquis Buggy , referral has been closed due to patient not returning their calls to schedule appointment-Toni

## 2022-02-17 ENCOUNTER — Encounter: Payer: Self-pay | Admitting: Nurse Practitioner

## 2022-02-18 ENCOUNTER — Telehealth: Payer: Self-pay

## 2022-02-18 DIAGNOSIS — Z760 Encounter for issue of repeat prescription: Secondary | ICD-10-CM

## 2022-02-24 MED ORDER — ALPRAZOLAM 0.25 MG PO TABS: 0.2500 mg | ORAL_TABLET | Freq: Two times a day (BID) | ORAL | 0 refills | Status: DC | PRN

## 2022-02-24 NOTE — Telephone Encounter (Signed)
Patient notified

## 2022-03-04 ENCOUNTER — Other Ambulatory Visit: Payer: Self-pay | Admitting: Nurse Practitioner

## 2022-03-04 DIAGNOSIS — Z760 Encounter for issue of repeat prescription: Secondary | ICD-10-CM

## 2022-03-25 DIAGNOSIS — D2261 Melanocytic nevi of right upper limb, including shoulder: Secondary | ICD-10-CM | POA: Diagnosis not present

## 2022-03-25 DIAGNOSIS — D2271 Melanocytic nevi of right lower limb, including hip: Secondary | ICD-10-CM | POA: Diagnosis not present

## 2022-03-25 DIAGNOSIS — D2272 Melanocytic nevi of left lower limb, including hip: Secondary | ICD-10-CM | POA: Diagnosis not present

## 2022-03-25 DIAGNOSIS — D225 Melanocytic nevi of trunk: Secondary | ICD-10-CM | POA: Diagnosis not present

## 2022-03-25 DIAGNOSIS — L821 Other seborrheic keratosis: Secondary | ICD-10-CM | POA: Diagnosis not present

## 2022-03-25 DIAGNOSIS — D2262 Melanocytic nevi of left upper limb, including shoulder: Secondary | ICD-10-CM | POA: Diagnosis not present

## 2022-04-09 ENCOUNTER — Other Ambulatory Visit: Payer: Self-pay | Admitting: Nurse Practitioner

## 2022-04-09 DIAGNOSIS — Z760 Encounter for issue of repeat prescription: Secondary | ICD-10-CM

## 2022-04-09 NOTE — Telephone Encounter (Signed)
Pt need appt for further refills.

## 2022-04-15 ENCOUNTER — Ambulatory Visit (INDEPENDENT_AMBULATORY_CARE_PROVIDER_SITE_OTHER): Payer: 59 | Admitting: Nurse Practitioner

## 2022-04-15 ENCOUNTER — Encounter: Payer: Self-pay | Admitting: Nurse Practitioner

## 2022-04-15 VITALS — BP 138/76 | HR 88 | Temp 96.9°F | Resp 16 | Ht 65.0 in | Wt 207.8 lb

## 2022-04-15 DIAGNOSIS — F411 Generalized anxiety disorder: Secondary | ICD-10-CM

## 2022-04-15 DIAGNOSIS — Z0001 Encounter for general adult medical examination with abnormal findings: Secondary | ICD-10-CM

## 2022-04-15 DIAGNOSIS — E559 Vitamin D deficiency, unspecified: Secondary | ICD-10-CM

## 2022-04-15 DIAGNOSIS — E782 Mixed hyperlipidemia: Secondary | ICD-10-CM

## 2022-04-15 DIAGNOSIS — Z1231 Encounter for screening mammogram for malignant neoplasm of breast: Secondary | ICD-10-CM | POA: Diagnosis not present

## 2022-04-15 DIAGNOSIS — I10 Essential (primary) hypertension: Secondary | ICD-10-CM | POA: Diagnosis not present

## 2022-04-15 DIAGNOSIS — R300 Dysuria: Secondary | ICD-10-CM

## 2022-04-15 MED ORDER — ALPRAZOLAM 0.25 MG PO TABS: 0.2500 mg | ORAL_TABLET | Freq: Two times a day (BID) | ORAL | 2 refills | Status: DC | PRN

## 2022-04-15 MED ORDER — ATORVASTATIN CALCIUM 10 MG PO TABS: 10.0000 mg | ORAL_TABLET | Freq: Every day | ORAL | 1 refills | Status: DC

## 2022-04-15 MED ORDER — OMEPRAZOLE 40 MG PO CPDR: DELAYED_RELEASE_CAPSULE | ORAL | 1 refills | Status: DC

## 2022-04-15 MED ORDER — ATENOLOL 25 MG PO TABS: ORAL_TABLET | ORAL | 1 refills | Status: DC

## 2022-04-15 MED ORDER — AMLODIPINE BESYLATE 5 MG PO TABS: ORAL_TABLET | ORAL | 1 refills | Status: DC

## 2022-04-15 MED ORDER — VITAMIN D (ERGOCALCIFEROL) 1.25 MG (50000 UNIT) PO CAPS: 50000.0000 [IU] | ORAL_CAPSULE | ORAL | 5 refills | Status: DC

## 2022-04-15 MED ORDER — MELOXICAM 15 MG PO TABS: ORAL_TABLET | ORAL | 1 refills | Status: DC

## 2022-04-15 NOTE — Progress Notes (Unsigned)
[facility] [place], [place] [address]  Internal MEDICINE  Office Visit Note  Patient Name: Jamie Sanchez  [id number]  [id number]  Date of Service: 04/15/2022  Chief Complaint  Patient presents with   Gastroesophageal Reflux   Hyperlipidemia   Annual Exam    HPI Wilbert presents for an annual well visit and physical exam.  Well-appearing 52 y.o. female with hypertension, high cholesterol, IBS, and GERD Routine CRC screening: due in 2027 Routine mammogram: done 2023 Labs: discussed w/patient -- labs are normal except for cholesterol, triglycerides and VLDL are elevated, HDL is low.  New or worsening pain: none Other concerns: Right ankle sprain in November -- Ankle is healing and feeling better IBS-D -- no worsening of symptoms Sees derm, had TBSE--everything was good   Current Medication: Outpatient Encounter Medications as of 04/15/2022  Medication Sig   [DISCONTINUED] ALPRAZolam (XANAX) 0.25 MG tablet Take 1 tablet (0.25 mg total) by mouth 2 (two) times daily as needed for anxiety.   [DISCONTINUED] amLODipine (NORVASC) 5 MG tablet TAKE 1 TABLET(5 MG) BY MOUTH DAILY   [DISCONTINUED] atenolol (TENORMIN) 25 MG tablet TAKE 1 TABLET(25 MG) BY MOUTH DAILY   [DISCONTINUED] atorvastatin (LIPITOR) 10 MG tablet Take 1 tablet (10 mg total) by mouth daily.   [DISCONTINUED] meloxicam (MOBIC) 15 MG tablet TAKE 1 TABLET(15 MG) BY MOUTH DAILY   [DISCONTINUED] omeprazole (PRILOSEC) 40 MG capsule TAKE 1 CAPSULE(40 MG) BY MOUTH DAILY   [DISCONTINUED] Vitamin D, Ergocalciferol, (DRISDOL) 1.25 MG (50000 UNIT) CAPS capsule Take 1 capsule (50,000 Units total) by mouth every 7 (seven) days.   ALPRAZolam (XANAX) 0.25 MG tablet Take 1 tablet (0.25 mg total) by mouth 2 (two) times daily as needed for anxiety.   amLODipine (NORVASC) 5 MG tablet TAKE 1 TABLET(5 MG) BY MOUTH DAILY   atenolol (TENORMIN) 25 MG tablet TAKE 1 TABLET(25 MG) BY MOUTH DAILY   atorvastatin (LIPITOR) 10  MG tablet Take 1 tablet (10 mg total) by mouth daily.   meloxicam (MOBIC) 15 MG tablet TAKE 1 TABLET(15 MG) BY MOUTH DAILY   omeprazole (PRILOSEC) 40 MG capsule TAKE 1 CAPSULE(40 MG) BY MOUTH DAILY   Vitamin D, Ergocalciferol, (DRISDOL) 1.25 MG (50000 UNIT) CAPS capsule Take 1 capsule (50,000 Units total) by mouth every 7 (seven) days.   No facility-administered encounter medications on file as of 04/15/2022.    Surgical History: Past Surgical History:  Procedure Laterality Date   ABDOMINAL HYSTERECTOMY     APPENDECTOMY     INCONTINENCE SURGERY     KNEE ARTHROSCOPY Left     Medical History: Past Medical History:  Diagnosis Date   GERD (gastroesophageal reflux disease)    Hyperlipidemia     Family History: Family History  Problem Relation Age of Onset   Heart attack Mother    Hypertension Father    Kidney cancer Father    Hypertension Sister    Heart attack Sister    Hypertension Brother    Heart attack Brother     Social History   Socioeconomic History   Marital status: Married    Spouse name: Not on file   Number of children: Not on file   Years of education: Not on file   Highest education level: Not on file  Occupational History   Not on file  Tobacco Use   Smoking status: Every Day    Packs/day: 0.50    Types: Cigarettes   Smokeless tobacco: Never  Vaping Use   Vaping Use:  Never used  Substance and Sexual Activity   Alcohol use: Yes    Comment: occ   Drug use: Never   Sexual activity: Not on file  Other Topics Concern   Not on file  Social History Narrative   Not on file   Social Determinants of Health   Financial Resource Strain: Not on file  Food Insecurity: Not on file  Transportation Needs: Not on file  Physical Activity: Not on file  Stress: Not on file  Social Connections: Not on file  Intimate Partner Violence: Not on file      Review of Systems  Constitutional:  Negative for activity change, appetite change, chills, fatigue,  fever and unexpected weight change.  HENT: Negative.  Negative for congestion, ear pain, rhinorrhea, sore throat and trouble swallowing.   Eyes: Negative.   Respiratory: Negative.  Negative for cough, chest tightness, shortness of breath and wheezing.   Cardiovascular: Negative.  Negative for chest pain.  Gastrointestinal: Negative.  Negative for abdominal pain, blood in stool, constipation, diarrhea, nausea and vomiting.  Endocrine: Negative.   Genitourinary: Negative.  Negative for difficulty urinating, dysuria, frequency, hematuria and urgency.  Musculoskeletal: Negative.  Negative for arthralgias, back pain, joint swelling, myalgias and neck pain.  Skin: Negative.  Negative for rash and wound.  Allergic/Immunologic: Negative.  Negative for immunocompromised state.  Neurological: Negative.  Negative for dizziness, seizures, numbness and headaches.  Hematological: Negative.   Psychiatric/Behavioral: Negative.  Negative for behavioral problems, self-injury and suicidal ideas. The patient is not nervous/anxious.     Vital Signs: BP 138/76   Pulse 88   Temp (!) 96.9 F (36.1 C)   Resp 16   Ht '5\' 5"'$  (1.651 m)   Wt 207 lb 12.8 oz (94.3 kg)   SpO2 95%   BMI 34.58 kg/m    Physical Exam Vitals reviewed.  Constitutional:      General: She is awake. She is not in acute distress.    Appearance: Normal appearance. She is well-developed and well-groomed. She is obese. She is not ill-appearing or diaphoretic.  HENT:     Head: Normocephalic and atraumatic.     Right Ear: Tympanic membrane, ear canal and external ear normal.     Left Ear: Tympanic membrane, ear canal and external ear normal.     Nose: Nose normal. No congestion or rhinorrhea.     Mouth/Throat:     Lips: Pink.     Mouth: Mucous membranes are moist.     Pharynx: Oropharynx is clear. Uvula midline. No oropharyngeal exudate or posterior oropharyngeal erythema.  Eyes:     General: Lids are normal. Vision grossly intact. Gaze  aligned appropriately. No scleral icterus.       Right eye: No discharge.        Left eye: No discharge.     Extraocular Movements: Extraocular movements intact.     Conjunctiva/sclera: Conjunctivae normal.     Pupils: Pupils are equal, round, and reactive to light.     Funduscopic exam:    Right eye: Red reflex present.        Left eye: Red reflex present. Neck:     Thyroid: No thyromegaly.     Vascular: No JVD.     Trachea: Trachea and phonation normal. No tracheal deviation.  Cardiovascular:     Rate and Rhythm: Normal rate and regular rhythm.     Pulses: Normal pulses.     Heart sounds: Normal heart sounds, S1 normal and S2 normal. No  murmur heard.    No friction rub. No gallop.  Pulmonary:     Effort: Pulmonary effort is normal. No accessory muscle usage or respiratory distress.     Breath sounds: Normal breath sounds and air entry. No stridor. No wheezing or rales.  Chest:     Chest wall: No tenderness.     Comments: Declined clinical breast exam, gets annual mammograms.  Abdominal:     General: Bowel sounds are normal. There is no distension.     Palpations: Abdomen is soft. There is no shifting dullness, fluid wave, mass or pulsatile mass.     Tenderness: There is no abdominal tenderness. There is no guarding or rebound.  Musculoskeletal:        General: No tenderness or deformity. Normal range of motion.     Cervical back: Normal range of motion and neck supple.     Right lower leg: Edema (residual edema from right ankle sprain.) present.     Left lower leg: No edema.  Lymphadenopathy:     Cervical: No cervical adenopathy.  Skin:    General: Skin is warm and dry.     Capillary Refill: Capillary refill takes less than 2 seconds.     Coloration: Skin is not pale.     Findings: No erythema or rash.  Neurological:     Mental Status: She is alert and oriented to person, place, and time.     Cranial Nerves: No cranial nerve deficit.     Motor: No abnormal muscle tone.      Coordination: Coordination normal.     Gait: Gait normal.     Deep Tendon Reflexes: Reflexes are normal and symmetric.  Psychiatric:        Mood and Affect: Mood and affect normal.        Behavior: Behavior normal. Behavior is cooperative.        Thought Content: Thought content normal.        Judgment: Judgment normal.        Assessment/Plan: 1. Encounter for routine adult health examination with abnormal findings Age-appropriate preventive screenings and vaccinations discussed, annual physical exam completed. Routine labs for health maintenance results discussed with patient today. PHM updated. Medication list reviewed with patient and updated, needed refills ordered.  - amLODipine (NORVASC) 5 MG tablet; TAKE 1 TABLET(5 MG) BY MOUTH DAILY  Dispense: 90 tablet; Refill: 1 - atenolol (TENORMIN) 25 MG tablet; TAKE 1 TABLET(25 MG) BY MOUTH DAILY  Dispense: 90 tablet; Refill: 1 - atorvastatin (LIPITOR) 10 MG tablet; Take 1 tablet (10 mg total) by mouth daily.  Dispense: 90 tablet; Refill: 1 - meloxicam (MOBIC) 15 MG tablet; TAKE 1 TABLET(15 MG) BY MOUTH DAILY  Dispense: 90 tablet; Refill: 1 - Vitamin D, Ergocalciferol, (DRISDOL) 1.25 MG (50000 UNIT) CAPS capsule; Take 1 capsule (50,000 Units total) by mouth every 7 (seven) days.  Dispense: 4 capsule; Refill: 5 - omeprazole (PRILOSEC) 40 MG capsule; TAKE 1 CAPSULE(40 MG) BY MOUTH DAILY  Dispense: 90 capsule; Refill: 1  2. Essential hypertension Stable, continue atenolol and amlodipine as prescribed.  - amLODipine (NORVASC) 5 MG tablet; TAKE 1 TABLET(5 MG) BY MOUTH DAILY  Dispense: 90 tablet; Refill: 1 - atenolol (TENORMIN) 25 MG tablet; TAKE 1 TABLET(25 MG) BY MOUTH DAILY  Dispense: 90 tablet; Refill: 1  3. Mixed hyperlipidemia Continue atorvastatin as prescribed. Also discussed adding a fish oil supplement.  - atorvastatin (LIPITOR) 10 MG tablet; Take 1 tablet (10 mg total) by mouth daily.  Dispense:  90 tablet; Refill: 1  4. Vitamin D  deficiency May continue weekly vitamin D supplement as request. Vitamin D level is low normal.  - Vitamin D, Ergocalciferol, (DRISDOL) 1.25 MG (50000 UNIT) CAPS capsule; Take 1 capsule (50,000 Units total) by mouth every 7 (seven) days.  Dispense: 4 capsule; Refill: 5  5. Dysuria Routine urinalysis done  - UA/M w/rflx Culture, Routine  6. Encounter for screening mammogram for malignant neoplasm of breast Routine mammogram ordered - MM 3D SCREEN BREAST BILATERAL; Future  7. Generalized anxiety disorder Refills x3 months prescribed. Follow up in 3 months for additional refills.  - ALPRAZolam (XANAX) 0.25 MG tablet; Take 1 tablet (0.25 mg total) by mouth 2 (two) times daily as needed for anxiety.  Dispense: 60 tablet; Refill: 2      General Counseling: gage nagle understanding of the findings of todays visit and agrees with plan of treatment. I have discussed any further diagnostic evaluation that may be needed or ordered today. We also reviewed her medications today. she has been encouraged to call the office with any questions or concerns that should arise related to todays visit.    Orders Placed This Encounter  Procedures   MM 3D SCREEN BREAST BILATERAL   UA/M w/rflx Culture, Routine    Meds ordered this encounter  Medications   ALPRAZolam (XANAX) 0.25 MG tablet    Sig: Take 1 tablet (0.25 mg total) by mouth 2 (two) times daily as needed for anxiety.    Dispense:  60 tablet    Refill:  2   amLODipine (NORVASC) 5 MG tablet    Sig: TAKE 1 TABLET(5 MG) BY MOUTH DAILY    Dispense:  90 tablet    Refill:  1   atenolol (TENORMIN) 25 MG tablet    Sig: TAKE 1 TABLET(25 MG) BY MOUTH DAILY    Dispense:  90 tablet    Refill:  1   atorvastatin (LIPITOR) 10 MG tablet    Sig: Take 1 tablet (10 mg total) by mouth daily.    Dispense:  90 tablet    Refill:  1   meloxicam (MOBIC) 15 MG tablet    Sig: TAKE 1 TABLET(15 MG) BY MOUTH DAILY    Dispense:  90 tablet    Refill:  1    Vitamin D, Ergocalciferol, (DRISDOL) 1.25 MG (50000 UNIT) CAPS capsule    Sig: Take 1 capsule (50,000 Units total) by mouth every 7 (seven) days.    Dispense:  4 capsule    Refill:  5   omeprazole (PRILOSEC) 40 MG capsule    Sig: TAKE 1 CAPSULE(40 MG) BY MOUTH DAILY    Dispense:  90 capsule    Refill:  1    Return in about 3 months (around 07/07/2022) for F/U, anxiety med refill, Stryker Veasey PCP, need UDS at next office visit. .   Total time spent:30 Minutes Time spent includes review of chart, medications, test results, and follow up plan with the patient.   [place] Controlled Substance Database was reviewed by me.  This patient was seen by Jonetta Osgood, FNP-C in collaboration with Dr. Clayborn Bigness as a part of collaborative care agreement.  Gwenneth Whiteman R. Valetta Fuller, MSN, FNP-C Internal medicine

## 2022-04-17 ENCOUNTER — Encounter: Payer: Self-pay | Admitting: Nurse Practitioner

## 2022-06-17 ENCOUNTER — Other Ambulatory Visit: Payer: Self-pay | Admitting: Nurse Practitioner

## 2022-06-17 DIAGNOSIS — E782 Mixed hyperlipidemia: Secondary | ICD-10-CM

## 2022-06-17 DIAGNOSIS — Z0001 Encounter for general adult medical examination with abnormal findings: Secondary | ICD-10-CM

## 2022-07-10 ENCOUNTER — Ambulatory Visit (INDEPENDENT_AMBULATORY_CARE_PROVIDER_SITE_OTHER): Payer: 59 | Admitting: Nurse Practitioner

## 2022-07-10 ENCOUNTER — Encounter: Payer: Self-pay | Admitting: Nurse Practitioner

## 2022-07-10 VITALS — BP 130/86 | HR 73 | Temp 98.3°F | Resp 16 | Ht 65.0 in | Wt 210.0 lb

## 2022-07-10 DIAGNOSIS — I10 Essential (primary) hypertension: Secondary | ICD-10-CM

## 2022-07-10 DIAGNOSIS — F411 Generalized anxiety disorder: Secondary | ICD-10-CM

## 2022-07-10 DIAGNOSIS — Z79899 Other long term (current) drug therapy: Secondary | ICD-10-CM

## 2022-07-10 LAB — POCT URINE DRUG SCREEN
Methylenedioxyamphetamine: NOT DETECTED
POC Amphetamine UR: NOT DETECTED
POC BENZODIAZEPINES UR: POSITIVE — AB
POC Barbiturate UR: NOT DETECTED
POC Cocaine UR: NOT DETECTED
POC Ecstasy UR: NOT DETECTED
POC Marijuana UR: NOT DETECTED
POC Methadone UR: NOT DETECTED
POC Methamphetamine UR: NOT DETECTED
POC Opiate Ur: NOT DETECTED
POC Oxycodone UR: NOT DETECTED
POC PHENCYCLIDINE UR: NOT DETECTED
POC TRICYCLICS UR: POSITIVE — AB

## 2022-07-10 MED ORDER — AMLODIPINE BESYLATE 5 MG PO TABS: ORAL_TABLET | ORAL | 1 refills | Status: DC

## 2022-07-10 MED ORDER — ALPRAZOLAM 0.25 MG PO TABS: 0.2500 mg | ORAL_TABLET | Freq: Two times a day (BID) | ORAL | 2 refills | Status: DC | PRN

## 2022-07-10 MED ORDER — VITAMIN D (ERGOCALCIFEROL) 1.25 MG (50000 UNIT) PO CAPS: 50000.0000 [IU] | ORAL_CAPSULE | ORAL | 5 refills | Status: DC

## 2022-07-10 MED ORDER — OMEPRAZOLE 40 MG PO CPDR: DELAYED_RELEASE_CAPSULE | ORAL | 1 refills | Status: DC

## 2022-07-10 MED ORDER — ATENOLOL 25 MG PO TABS: ORAL_TABLET | ORAL | 1 refills | Status: DC

## 2022-07-10 MED ORDER — MELOXICAM 15 MG PO TABS: ORAL_TABLET | ORAL | 1 refills | Status: DC

## 2022-07-10 MED ORDER — ATORVASTATIN CALCIUM 10 MG PO TABS: 10.0000 mg | ORAL_TABLET | Freq: Every day | ORAL | 0 refills | Status: DC

## 2022-07-10 NOTE — Progress Notes (Signed)
[facility] [address] [place], [place] [address]  Internal MEDICINE  Office Visit Note  Patient Name: Jamie Sanchez  [id number]  [id number]  Date of Service: 07/10/2022  Chief Complaint  Patient presents with   Gastroesophageal Reflux   Hyperlipidemia   Follow-up    Med refills    HPI Jamie Sanchez presents for a follow-up visit for anxiety, hypertension and refills.  Anxiety -- taking alprazolam prn.  Hypertension -- controlled with current medication -- takes amlodipine and atenolol.  Mammogram due, will schedule Due for some other refills as well.      Current Medication: Outpatient Encounter Medications as of 07/10/2022  Medication Sig   [DISCONTINUED] ALPRAZolam (XANAX) 0.25 MG tablet Take 1 tablet (0.25 mg total) by mouth 2 (two) times daily as needed for anxiety.   [DISCONTINUED] amLODipine (NORVASC) 5 MG tablet TAKE 1 TABLET(5 MG) BY MOUTH DAILY   [DISCONTINUED] atenolol (TENORMIN) 25 MG tablet TAKE 1 TABLET(25 MG) BY MOUTH DAILY   [DISCONTINUED] atorvastatin (LIPITOR) 10 MG tablet TAKE ONE TABLET BY MOUTH DAILY   [DISCONTINUED] meloxicam (MOBIC) 15 MG tablet TAKE 1 TABLET(15 MG) BY MOUTH DAILY   [DISCONTINUED] omeprazole (PRILOSEC) 40 MG capsule TAKE 1 CAPSULE(40 MG) BY MOUTH DAILY   [DISCONTINUED] Vitamin D, Ergocalciferol, (DRISDOL) 1.25 MG (50000 UNIT) CAPS capsule Take 1 capsule (50,000 Units total) by mouth every 7 (seven) days.   ALPRAZolam (XANAX) 0.25 MG tablet Take 1 tablet (0.25 mg total) by mouth 2 (two) times daily as needed for anxiety.   amLODipine (NORVASC) 5 MG tablet TAKE 1 TABLET(5 MG) BY MOUTH DAILY   atenolol (TENORMIN) 25 MG tablet TAKE 1 TABLET(25 MG) BY MOUTH DAILY   atorvastatin (LIPITOR) 10 MG tablet Take 1 tablet (10 mg total) by mouth daily.   meloxicam (MOBIC) 15 MG tablet TAKE 1 TABLET(15 MG) BY MOUTH DAILY   omeprazole (PRILOSEC) 40 MG capsule TAKE 1 CAPSULE(40 MG) BY MOUTH DAILY   Vitamin D, Ergocalciferol, (DRISDOL) 1.25 MG  (50000 UNIT) CAPS capsule Take 1 capsule (50,000 Units total) by mouth every 7 (seven) days.   No facility-administered encounter medications on file as of 07/10/2022.    Surgical History: Past Surgical History:  Procedure Laterality Date   ABDOMINAL HYSTERECTOMY     APPENDECTOMY     INCONTINENCE SURGERY     KNEE ARTHROSCOPY Left     Medical History: Past Medical History:  Diagnosis Date   GERD (gastroesophageal reflux disease)    Hyperlipidemia     Family History: Family History  Problem Relation Age of Onset   Heart attack Mother    Hypertension Father    Kidney cancer Father    Hypertension Sister    Heart attack Sister    Hypertension Brother    Heart attack Brother     Social History   Socioeconomic History   Marital status: Married    Spouse name: Not on file   Number of children: Not on file   Years of education: Not on file   Highest education level: Not on file  Occupational History   Not on file  Tobacco Use   Smoking status: Every Day    Packs/day: .5    Types: Cigarettes   Smokeless tobacco: Never  Vaping Use   Vaping Use: Never used  Substance and Sexual Activity   Alcohol use: Yes    Comment: occ   Drug use: Never   Sexual activity: Not on file  Other Topics Concern   Not on  file  Social History Narrative   Not on file   Social Determinants of Health   Financial Resource Strain: Not on file  Food Insecurity: Not on file  Transportation Needs: Not on file  Physical Activity: Not on file  Stress: Not on file  Social Connections: Not on file  Intimate Partner Violence: Not on file      Review of Systems  Constitutional:  Negative for chills, fatigue and unexpected weight change.  HENT:  Negative for congestion, rhinorrhea, sneezing and sore throat.   Eyes:  Negative for redness.  Respiratory:  Negative for cough, chest tightness and shortness of breath.   Cardiovascular:  Negative for chest pain and palpitations.   Gastrointestinal:  Negative for abdominal pain, constipation, diarrhea, nausea and vomiting.  Genitourinary:  Negative for dysuria and frequency.  Musculoskeletal:  Negative for arthralgias, back pain, joint swelling and neck pain.  Skin:  Negative for rash.  Neurological: Negative.  Negative for tremors and numbness.  Hematological:  Negative for adenopathy. Does not bruise/bleed easily.  Psychiatric/Behavioral:  Negative for behavioral problems (Depression), sleep disturbance and suicidal ideas. The patient is not nervous/anxious.     Vital Signs: BP 130/86   Pulse 73   Temp 98.3 F (36.8 C)   Resp 16   Ht 5\' 5"  (1.651 m)   Wt 210 lb (95.3 kg)   SpO2 97%   BMI 34.95 kg/m    Physical Exam Vitals reviewed.  Constitutional:      Appearance: Normal appearance. She is obese.  HENT:     Head: Normocephalic and atraumatic.  Cardiovascular:     Rate and Rhythm: Normal rate and regular rhythm.     Pulses: Normal pulses.     Heart sounds: Normal heart sounds. No murmur heard. Pulmonary:     Effort: Pulmonary effort is normal. No respiratory distress.     Breath sounds: Normal breath sounds.  Skin:    General: Skin is warm and dry.     Capillary Refill: Capillary refill takes less than 2 seconds.  Neurological:     Mental Status: She is alert and oriented to person, place, and time.  Psychiatric:        Mood and Affect: Mood normal.        Behavior: Behavior normal.        Assessment/Plan: 1. Essential hypertension Stable, continue amlodipine and atenolol as prescribed.  - amLODipine (NORVASC) 5 MG tablet; TAKE 1 TABLET(5 MG) BY MOUTH DAILY  Dispense: 90 tablet; Refill: 1 - atenolol (TENORMIN) 25 MG tablet; TAKE 1 TABLET(25 MG) BY MOUTH DAILY  Dispense: 90 tablet; Refill: 1  2. Generalized anxiety disorder Continue prn alprazolam as prescribed. Follow up in 3 months for additional refills.  - ALPRAZolam (XANAX) 0.25 MG tablet; Take 1 tablet (0.25 mg total) by mouth 2  (two) times daily as needed for anxiety.  Dispense: 60 tablet; Refill: 2  3. Encounter for long-term (current) use of high-risk medication UDS done, positive for benzodiazepine which is consistent with her current prescriptions.  - POCT Urine Drug Screen  4. Encounter for medication review Medication list reviewed, updated and refills ordered  - atorvastatin (LIPITOR) 10 MG tablet; Take 1 tablet (10 mg total) by mouth daily.  Dispense: 90 tablet; Refill: 0 - meloxicam (MOBIC) 15 MG tablet; TAKE 1 TABLET(15 MG) BY MOUTH DAILY  Dispense: 90 tablet; Refill: 1 - omeprazole (PRILOSEC) 40 MG capsule; TAKE 1 CAPSULE(40 MG) BY MOUTH DAILY  Dispense: 90 capsule; Refill: 1 -  Vitamin D, Ergocalciferol, (DRISDOL) 1.25 MG (50000 UNIT) CAPS capsule; Take 1 capsule (50,000 Units total) by mouth every 7 (seven) days.  Dispense: 4 capsule; Refill: 5   General Counseling: yarithza arvie understanding of the findings of todays visit and agrees with plan of treatment. I have discussed any further diagnostic evaluation that may be needed or ordered today. We also reviewed her medications today. she has been encouraged to call the office with any questions or concerns that should arise related to todays visit.    Orders Placed This Encounter  Procedures   POCT Urine Drug Screen    Meds ordered this encounter  Medications   amLODipine (NORVASC) 5 MG tablet    Sig: TAKE 1 TABLET(5 MG) BY MOUTH DAILY    Dispense:  90 tablet    Refill:  1   atenolol (TENORMIN) 25 MG tablet    Sig: TAKE 1 TABLET(25 MG) BY MOUTH DAILY    Dispense:  90 tablet    Refill:  1   atorvastatin (LIPITOR) 10 MG tablet    Sig: Take 1 tablet (10 mg total) by mouth daily.    Dispense:  90 tablet    Refill:  0   meloxicam (MOBIC) 15 MG tablet    Sig: TAKE 1 TABLET(15 MG) BY MOUTH DAILY    Dispense:  90 tablet    Refill:  1   omeprazole (PRILOSEC) 40 MG capsule    Sig: TAKE 1 CAPSULE(40 MG) BY MOUTH DAILY    Dispense:  90  capsule    Refill:  1   ALPRAZolam (XANAX) 0.25 MG tablet    Sig: Take 1 tablet (0.25 mg total) by mouth 2 (two) times daily as needed for anxiety.    Dispense:  60 tablet    Refill:  2   Vitamin D, Ergocalciferol, (DRISDOL) 1.25 MG (50000 UNIT) CAPS capsule    Sig: Take 1 capsule (50,000 Units total) by mouth every 7 (seven) days.    Dispense:  4 capsule    Refill:  5    Return in about 12 weeks (around 10/02/2022) for F/U, anxiety med refill, Corgan Mormile PCP.   Total time spent:30 Minutes Time spent includes review of chart, medications, test results, and follow up plan with the patient.   [place] Controlled Substance Database was reviewed by me.  This patient was seen by Sallyanne Kuster, FNP-C in collaboration with Dr. Beverely Risen as a part of collaborative care agreement.   Ronee Ranganathan R. Tedd Sias, MSN, FNP-C Internal medicine

## 2022-07-11 ENCOUNTER — Telehealth: Payer: Self-pay

## 2022-07-11 NOTE — Telephone Encounter (Signed)
Sent P.A. for patient's Omeprazole.

## 2022-07-14 ENCOUNTER — Encounter: Payer: Self-pay | Admitting: Nurse Practitioner

## 2022-09-29 ENCOUNTER — Ambulatory Visit (INDEPENDENT_AMBULATORY_CARE_PROVIDER_SITE_OTHER): Payer: 59 | Admitting: Nurse Practitioner

## 2022-09-29 ENCOUNTER — Encounter: Payer: Self-pay | Admitting: Nurse Practitioner

## 2022-09-29 VITALS — BP 130/82 | HR 80 | Temp 98.3°F | Resp 16 | Ht 65.0 in | Wt 208.6 lb

## 2022-09-29 DIAGNOSIS — I10 Essential (primary) hypertension: Secondary | ICD-10-CM | POA: Diagnosis not present

## 2022-09-29 DIAGNOSIS — F411 Generalized anxiety disorder: Secondary | ICD-10-CM

## 2022-09-29 DIAGNOSIS — H60333 Swimmer's ear, bilateral: Secondary | ICD-10-CM

## 2022-09-29 MED ORDER — ALPRAZOLAM 0.25 MG PO TABS: 0.2500 mg | ORAL_TABLET | Freq: Two times a day (BID) | ORAL | 2 refills | Status: DC | PRN

## 2022-09-29 MED ORDER — OFLOXACIN 0.3 % OT SOLN: 5.0000 [drp] | Freq: Every day | OTIC | 0 refills | Status: AC

## 2022-09-29 NOTE — Progress Notes (Signed)
[facility] [address] [place], [place] [address]  Internal MEDICINE  Office Visit Note  Patient Name: Jamie Sanchez  [id number]  [id number]  Date of Service: 09/29/2022  Chief Complaint  Patient presents with   Gastroesophageal Reflux   Hyperlipidemia   Follow-up    HPI Jamie Sanchez presents for a follow-up visit for  Anxiety -- refills  Ear pain bilateral --     Current Medication: Outpatient Encounter Medications as of 09/29/2022  Medication Sig   amLODipine (NORVASC) 5 MG tablet TAKE 1 TABLET(5 MG) BY MOUTH DAILY   atenolol (TENORMIN) 25 MG tablet TAKE 1 TABLET(25 MG) BY MOUTH DAILY   atorvastatin (LIPITOR) 10 MG tablet Take 1 tablet (10 mg total) by mouth daily.   meloxicam (MOBIC) 15 MG tablet TAKE 1 TABLET(15 MG) BY MOUTH DAILY   ofloxacin (FLOXIN) 0.3 % OTIC solution Place 5 drops into both ears daily for 5 days.   omeprazole (PRILOSEC) 40 MG capsule TAKE 1 CAPSULE(40 MG) BY MOUTH DAILY   Vitamin D, Ergocalciferol, (DRISDOL) 1.25 MG (50000 UNIT) CAPS capsule Take 1 capsule (50,000 Units total) by mouth every 7 (seven) days.   [DISCONTINUED] ALPRAZolam (XANAX) 0.25 MG tablet Take 1 tablet (0.25 mg total) by mouth 2 (two) times daily as needed for anxiety.   ALPRAZolam (XANAX) 0.25 MG tablet Take 1 tablet (0.25 mg total) by mouth 2 (two) times daily as needed for anxiety.   No facility-administered encounter medications on file as of 09/29/2022.    Surgical History: Past Surgical History:  Procedure Laterality Date   ABDOMINAL HYSTERECTOMY     APPENDECTOMY     INCONTINENCE SURGERY     KNEE ARTHROSCOPY Left     Medical History: Past Medical History:  Diagnosis Date   GERD (gastroesophageal reflux disease)    Hyperlipidemia     Family History: Family History  Problem Relation Age of Onset   Heart attack Mother    Hypertension Father    Kidney cancer Father    Hypertension Sister    Heart attack Sister    Hypertension Brother    Heart  attack Brother     Social History   Socioeconomic History   Marital status: Married    Spouse name: Not on file   Number of children: Not on file   Years of education: Not on file   Highest education level: Not on file  Occupational History   Not on file  Tobacco Use   Smoking status: Every Day    Current packs/day: 0.50    Types: Cigarettes   Smokeless tobacco: Never  Vaping Use   Vaping status: Never Used  Substance and Sexual Activity   Alcohol use: Yes    Comment: occ   Drug use: Never   Sexual activity: Not on file  Other Topics Concern   Not on file  Social History Narrative   Not on file   Social Determinants of Health   Financial Resource Strain: Not on file  Food Insecurity: Not on file  Transportation Needs: Not on file  Physical Activity: Not on file  Stress: Not on file  Social Connections: Not on file  Intimate Partner Violence: Not on file      Review of Systems  Vital Signs: BP 130/82   Pulse 80   Temp 98.3 F (36.8 C)   Resp 16   Ht 5\' 5"  (1.651 m)   Wt 208 lb 9.6 oz (94.6 kg)   SpO2 96%   BMI  34.71 kg/m    Physical Exam     Assessment/Plan:   General Counseling: Jamie Sanchez understanding of the findings of todays visit and agrees with plan of treatment. I have discussed any further diagnostic evaluation that may be needed or ordered today. We also reviewed her medications today. she has been encouraged to call the office with any questions or concerns that should arise related to todays visit.    No orders of the defined types were placed in this encounter.   Meds ordered this encounter  Medications   ALPRAZolam (XANAX) 0.25 MG tablet    Sig: Take 1 tablet (0.25 mg total) by mouth 2 (two) times daily as needed for anxiety.    Dispense:  60 tablet    Refill:  2    Refills ordered   ofloxacin (FLOXIN) 0.3 % OTIC solution    Sig: Place 5 drops into both ears daily for 5 days.    Dispense:  10 mL    Refill:  0     No follow-ups on file.   Total time spent:*** Minutes Time spent includes review of chart, medications, test results, and follow up plan with the patient.   [place] Controlled Substance Database was reviewed by me.  This patient was seen by Sallyanne Kuster, FNP-C in collaboration with Dr. Beverely Risen as a part of collaborative care agreement.    R. Tedd Sias, MSN, FNP-C Internal medicine

## 2022-09-30 ENCOUNTER — Encounter: Payer: Self-pay | Admitting: Nurse Practitioner

## 2022-11-19 ENCOUNTER — Other Ambulatory Visit: Payer: Self-pay | Admitting: Nurse Practitioner

## 2022-11-19 DIAGNOSIS — Z79899 Other long term (current) drug therapy: Secondary | ICD-10-CM

## 2022-12-22 ENCOUNTER — Ambulatory Visit: Payer: 59 | Admitting: Nurse Practitioner

## 2022-12-23 ENCOUNTER — Encounter: Payer: Self-pay | Admitting: Nurse Practitioner

## 2022-12-23 ENCOUNTER — Ambulatory Visit (INDEPENDENT_AMBULATORY_CARE_PROVIDER_SITE_OTHER): Payer: 59 | Admitting: Nurse Practitioner

## 2022-12-23 VITALS — BP 134/88 | HR 77 | Temp 98.5°F | Resp 16 | Ht 65.0 in | Wt 213.2 lb

## 2022-12-23 DIAGNOSIS — F411 Generalized anxiety disorder: Secondary | ICD-10-CM

## 2022-12-23 DIAGNOSIS — I10 Essential (primary) hypertension: Secondary | ICD-10-CM | POA: Diagnosis not present

## 2022-12-23 DIAGNOSIS — Z79899 Other long term (current) drug therapy: Secondary | ICD-10-CM | POA: Diagnosis not present

## 2022-12-23 MED ORDER — ALPRAZOLAM 0.25 MG PO TABS: 0.2500 mg | ORAL_TABLET | Freq: Two times a day (BID) | ORAL | 2 refills | Status: DC | PRN

## 2022-12-23 MED ORDER — ATORVASTATIN CALCIUM 10 MG PO TABS: 10.0000 mg | ORAL_TABLET | Freq: Every day | ORAL | 1 refills | Status: DC

## 2022-12-23 MED ORDER — OMEPRAZOLE 40 MG PO CPDR: DELAYED_RELEASE_CAPSULE | ORAL | 1 refills | Status: DC

## 2022-12-23 MED ORDER — MELOXICAM 15 MG PO TABS: ORAL_TABLET | ORAL | 1 refills | Status: DC

## 2022-12-23 MED ORDER — ATENOLOL 25 MG PO TABS: ORAL_TABLET | ORAL | 1 refills | Status: DC

## 2022-12-23 MED ORDER — VITAMIN D (ERGOCALCIFEROL) 1.25 MG (50000 UNIT) PO CAPS: 50000.0000 [IU] | ORAL_CAPSULE | ORAL | 5 refills | Status: DC

## 2022-12-23 MED ORDER — AMLODIPINE BESYLATE 5 MG PO TABS: ORAL_TABLET | ORAL | 1 refills | Status: DC

## 2022-12-23 NOTE — Progress Notes (Unsigned)
[facility] [address] [place], [place] [address]  Internal MEDICINE  Office Visit Note  Patient Name: Jamie Sanchez  [id number]  [id number]  Date of Service: 12/23/2022  Chief Complaint  Patient presents with   Gastroesophageal Reflux   Hyperlipidemia   Follow-up    HPI Priti presents for a follow-up visit for  Anxiety --     Current Medication: Outpatient Encounter Medications as of 12/23/2022  Medication Sig   [DISCONTINUED] ALPRAZolam (XANAX) 0.25 MG tablet Take 1 tablet (0.25 mg total) by mouth 2 (two) times daily as needed for anxiety.   [DISCONTINUED] amLODipine (NORVASC) 5 MG tablet TAKE 1 TABLET(5 MG) BY MOUTH DAILY   [DISCONTINUED] atenolol (TENORMIN) 25 MG tablet TAKE 1 TABLET(25 MG) BY MOUTH DAILY   [DISCONTINUED] atorvastatin (LIPITOR) 10 MG tablet TAKE ONE TABLET BY MOUTH DAILY   [DISCONTINUED] meloxicam (MOBIC) 15 MG tablet TAKE 1 TABLET(15 MG) BY MOUTH DAILY   [DISCONTINUED] omeprazole (PRILOSEC) 40 MG capsule TAKE 1 CAPSULE(40 MG) BY MOUTH DAILY   [DISCONTINUED] Vitamin D, Ergocalciferol, (DRISDOL) 1.25 MG (50000 UNIT) CAPS capsule Take 1 capsule (50,000 Units total) by mouth every 7 (seven) days.   ALPRAZolam (XANAX) 0.25 MG tablet Take 1 tablet (0.25 mg total) by mouth 2 (two) times daily as needed for anxiety.   amLODipine (NORVASC) 5 MG tablet TAKE 1 TABLET(5 MG) BY MOUTH DAILY   atenolol (TENORMIN) 25 MG tablet TAKE 1 TABLET(25 MG) BY MOUTH DAILY   atorvastatin (LIPITOR) 10 MG tablet Take 1 tablet (10 mg total) by mouth daily.   meloxicam (MOBIC) 15 MG tablet TAKE 1 TABLET(15 MG) BY MOUTH DAILY   omeprazole (PRILOSEC) 40 MG capsule TAKE 1 CAPSULE(40 MG) BY MOUTH DAILY   Vitamin D, Ergocalciferol, (DRISDOL) 1.25 MG (50000 UNIT) CAPS capsule Take 1 capsule (50,000 Units total) by mouth every 7 (seven) days.   No facility-administered encounter medications on file as of 12/23/2022.    Surgical History: Past Surgical History:  Procedure  Laterality Date   ABDOMINAL HYSTERECTOMY     APPENDECTOMY     INCONTINENCE SURGERY     KNEE ARTHROSCOPY Left     Medical History: Past Medical History:  Diagnosis Date   GERD (gastroesophageal reflux disease)    Hyperlipidemia     Family History: Family History  Problem Relation Age of Onset   Heart attack Mother    Hypertension Father    Kidney cancer Father    Hypertension Sister    Heart attack Sister    Hypertension Brother    Heart attack Brother     Social History   Socioeconomic History   Marital status: Married    Spouse name: Not on file   Number of children: Not on file   Years of education: Not on file   Highest education level: Not on file  Occupational History   Not on file  Tobacco Use   Smoking status: Every Day    Current packs/day: 0.50    Types: Cigarettes   Smokeless tobacco: Never  Vaping Use   Vaping status: Never Used  Substance and Sexual Activity   Alcohol use: Yes    Comment: occ   Drug use: Never   Sexual activity: Not on file  Other Topics Concern   Not on file  Social History Narrative   Not on file   Social Determinants of Health   Financial Resource Strain: Not on file  Food Insecurity: Not on file  Transportation Needs: Not on file  Physical Activity: Not on file  Stress: Not on file  Social Connections: Not on file  Intimate Partner Violence: Not on file      Review of Systems  Vital Signs: BP 134/88   Pulse 77   Temp 98.5 F (36.9 C)   Resp 16   Ht 5\' 5"  (1.651 m)   Wt 213 lb 3.2 oz (96.7 kg)   SpO2 96%   BMI 35.48 kg/m    Physical Exam     Assessment/Plan:   General Counseling: Rebie verbalizes understanding of the findings of todays visit and agrees with plan of treatment. I have discussed any further diagnostic evaluation that may be needed or ordered today. We also reviewed her medications today. she has been encouraged to call the office with any questions or concerns that should arise  related to todays visit.    No orders of the defined types were placed in this encounter.   Meds ordered this encounter  Medications   ALPRAZolam (XANAX) 0.25 MG tablet    Sig: Take 1 tablet (0.25 mg total) by mouth 2 (two) times daily as needed for anxiety.    Dispense:  60 tablet    Refill:  2    Refills ordered   omeprazole (PRILOSEC) 40 MG capsule    Sig: TAKE 1 CAPSULE(40 MG) BY MOUTH DAILY    Dispense:  90 capsule    Refill:  1   meloxicam (MOBIC) 15 MG tablet    Sig: TAKE 1 TABLET(15 MG) BY MOUTH DAILY    Dispense:  90 tablet    Refill:  1   atenolol (TENORMIN) 25 MG tablet    Sig: TAKE 1 TABLET(25 MG) BY MOUTH DAILY    Dispense:  90 tablet    Refill:  1   amLODipine (NORVASC) 5 MG tablet    Sig: TAKE 1 TABLET(5 MG) BY MOUTH DAILY    Dispense:  90 tablet    Refill:  1   atorvastatin (LIPITOR) 10 MG tablet    Sig: Take 1 tablet (10 mg total) by mouth daily.    Dispense:  90 tablet    Refill:  1   Vitamin D, Ergocalciferol, (DRISDOL) 1.25 MG (50000 UNIT) CAPS capsule    Sig: Take 1 capsule (50,000 Units total) by mouth every 7 (seven) days.    Dispense:  4 capsule    Refill:  5    Return in about 3 months (around 03/18/2023) for F/U, anxiety med refill, Denaja Verhoeven PCP.   Total time spent:*** Minutes Time spent includes review of chart, medications, test results, and follow up plan with the patient.   [place] Controlled Substance Database was reviewed by me.  This patient was seen by Sallyanne Kuster, FNP-C in collaboration with Dr. Beverely Risen as a part of collaborative care agreement.   Bronsen Serano R. Tedd Sias, MSN, FNP-C Internal medicine

## 2022-12-23 NOTE — Progress Notes (Incomplete)
[facility] [address] [place], [place] [address]  Internal MEDICINE  Office Visit Note  Patient Name: Jamie Sanchez  [id number]  [id number]  Date of Service: 12/23/2022  Chief Complaint  Patient presents with  . Gastroesophageal Reflux  . Hyperlipidemia  . Follow-up    HPI Stayce presents for a follow-up visit for  Anxiety --  Hypertension --      Current Medication: Outpatient Encounter Medications as of 12/23/2022  Medication Sig  . [DISCONTINUED] ALPRAZolam (XANAX) 0.25 MG tablet Take 1 tablet (0.25 mg total) by mouth 2 (two) times daily as needed for anxiety.  . [DISCONTINUED] amLODipine (NORVASC) 5 MG tablet TAKE 1 TABLET(5 MG) BY MOUTH DAILY  . [DISCONTINUED] atenolol (TENORMIN) 25 MG tablet TAKE 1 TABLET(25 MG) BY MOUTH DAILY  . [DISCONTINUED] atorvastatin (LIPITOR) 10 MG tablet TAKE ONE TABLET BY MOUTH DAILY  . [DISCONTINUED] meloxicam (MOBIC) 15 MG tablet TAKE 1 TABLET(15 MG) BY MOUTH DAILY  . [DISCONTINUED] omeprazole (PRILOSEC) 40 MG capsule TAKE 1 CAPSULE(40 MG) BY MOUTH DAILY  . [DISCONTINUED] Vitamin D, Ergocalciferol, (DRISDOL) 1.25 MG (50000 UNIT) CAPS capsule Take 1 capsule (50,000 Units total) by mouth every 7 (seven) days.  . ALPRAZolam (XANAX) 0.25 MG tablet Take 1 tablet (0.25 mg total) by mouth 2 (two) times daily as needed for anxiety.  Marland Kitchen amLODipine (NORVASC) 5 MG tablet TAKE 1 TABLET(5 MG) BY MOUTH DAILY  . atenolol (TENORMIN) 25 MG tablet TAKE 1 TABLET(25 MG) BY MOUTH DAILY  . atorvastatin (LIPITOR) 10 MG tablet Take 1 tablet (10 mg total) by mouth daily.  . meloxicam (MOBIC) 15 MG tablet TAKE 1 TABLET(15 MG) BY MOUTH DAILY  . omeprazole (PRILOSEC) 40 MG capsule TAKE 1 CAPSULE(40 MG) BY MOUTH DAILY  . Vitamin D, Ergocalciferol, (DRISDOL) 1.25 MG (50000 UNIT) CAPS capsule Take 1 capsule (50,000 Units total) by mouth every 7 (seven) days.   No facility-administered encounter medications on file as of 12/23/2022.    Surgical  History: Past Surgical History:  Procedure Laterality Date  . ABDOMINAL HYSTERECTOMY    . APPENDECTOMY    . INCONTINENCE SURGERY    . KNEE ARTHROSCOPY Left     Medical History: Past Medical History:  Diagnosis Date  . GERD (gastroesophageal reflux disease)   . Hyperlipidemia     Family History: Family History  Problem Relation Age of Onset  . Heart attack Mother   . Hypertension Father   . Kidney cancer Father   . Hypertension Sister   . Heart attack Sister   . Hypertension Brother   . Heart attack Brother     Social History   Socioeconomic History  . Marital status: Married    Spouse name: Not on file  . Number of children: Not on file  . Years of education: Not on file  . Highest education level: Not on file  Occupational History  . Not on file  Tobacco Use  . Smoking status: Every Day    Current packs/day: 0.50    Types: Cigarettes  . Smokeless tobacco: Never  Vaping Use  . Vaping status: Never Used  Substance and Sexual Activity  . Alcohol use: Yes    Comment: occ  . Drug use: Never  . Sexual activity: Not on file  Other Topics Concern  . Not on file  Social History Narrative  . Not on file   Social Determinants of Health   Financial Resource Strain: Not on file  Food Insecurity: Not on file  Transportation  Needs: Not on file  Physical Activity: Not on file  Stress: Not on file  Social Connections: Not on file  Intimate Partner Violence: Not on file      Review of Systems  Vital Signs: BP 134/88   Pulse 77   Temp 98.5 F (36.9 C)   Resp 16   Ht 5\' 5"  (1.651 m)   Wt 213 lb 3.2 oz (96.7 kg)   SpO2 96%   BMI 35.48 kg/m    Physical Exam     Assessment/Plan:   General Counseling: Lauria verbalizes understanding of the findings of todays visit and agrees with plan of treatment. I have discussed any further diagnostic evaluation that may be needed or ordered today. We also reviewed her medications today. she has been encouraged  to call the office with any questions or concerns that should arise related to todays visit.    No orders of the defined types were placed in this encounter.   Meds ordered this encounter  Medications  . ALPRAZolam (XANAX) 0.25 MG tablet    Sig: Take 1 tablet (0.25 mg total) by mouth 2 (two) times daily as needed for anxiety.    Dispense:  60 tablet    Refill:  2    Refills ordered  . omeprazole (PRILOSEC) 40 MG capsule    Sig: TAKE 1 CAPSULE(40 MG) BY MOUTH DAILY    Dispense:  90 capsule    Refill:  1  . meloxicam (MOBIC) 15 MG tablet    Sig: TAKE 1 TABLET(15 MG) BY MOUTH DAILY    Dispense:  90 tablet    Refill:  1  . atenolol (TENORMIN) 25 MG tablet    Sig: TAKE 1 TABLET(25 MG) BY MOUTH DAILY    Dispense:  90 tablet    Refill:  1  . amLODipine (NORVASC) 5 MG tablet    Sig: TAKE 1 TABLET(5 MG) BY MOUTH DAILY    Dispense:  90 tablet    Refill:  1  . atorvastatin (LIPITOR) 10 MG tablet    Sig: Take 1 tablet (10 mg total) by mouth daily.    Dispense:  90 tablet    Refill:  1  . Vitamin D, Ergocalciferol, (DRISDOL) 1.25 MG (50000 UNIT) CAPS capsule    Sig: Take 1 capsule (50,000 Units total) by mouth every 7 (seven) days.    Dispense:  4 capsule    Refill:  5    Return in about 3 months (around 03/18/2023) for F/U, anxiety med refill, Tamiya Colello PCP.   Total time spent:*** Minutes Time spent includes review of chart, medications, test results, and follow up plan with the patient.   [place] Controlled Substance Database was reviewed by me.  This patient was seen by Sallyanne Kuster, FNP-C in collaboration with Dr. Beverely Risen as a part of collaborative care agreement.   Noeh Sparacino R. Tedd Sias, MSN, FNP-C Internal medicine

## 2022-12-24 ENCOUNTER — Encounter: Payer: Self-pay | Admitting: Nurse Practitioner

## 2023-01-09 ENCOUNTER — Ambulatory Visit
Admission: RE | Admit: 2023-01-09 | Discharge: 2023-01-09 | Disposition: A | Discharge status: Home / Self Care | Payer: 59 | Source: Ambulatory Visit | Attending: Nurse Practitioner | Admitting: Nurse Practitioner

## 2023-01-09 DIAGNOSIS — Z1231 Encounter for screening mammogram for malignant neoplasm of breast: Secondary | ICD-10-CM | POA: Diagnosis not present

## 2023-01-26 ENCOUNTER — Encounter: Payer: Self-pay | Admitting: Nurse Practitioner

## 2023-01-26 DIAGNOSIS — E538 Deficiency of other specified B group vitamins: Secondary | ICD-10-CM

## 2023-01-26 DIAGNOSIS — E559 Vitamin D deficiency, unspecified: Secondary | ICD-10-CM

## 2023-01-26 DIAGNOSIS — R7301 Impaired fasting glucose: Secondary | ICD-10-CM

## 2023-01-26 DIAGNOSIS — E782 Mixed hyperlipidemia: Secondary | ICD-10-CM

## 2023-01-30 DIAGNOSIS — E538 Deficiency of other specified B group vitamins: Secondary | ICD-10-CM | POA: Diagnosis not present

## 2023-01-30 DIAGNOSIS — E559 Vitamin D deficiency, unspecified: Secondary | ICD-10-CM | POA: Diagnosis not present

## 2023-01-30 DIAGNOSIS — E782 Mixed hyperlipidemia: Secondary | ICD-10-CM | POA: Diagnosis not present

## 2023-01-30 DIAGNOSIS — R7301 Impaired fasting glucose: Secondary | ICD-10-CM | POA: Diagnosis not present

## 2023-01-31 LAB — CBC WITH DIFFERENTIAL/PLATELET
Basophils Absolute: 0.1 10*3/uL (ref 0.0–0.2)
Basos: 1 %
EOS (ABSOLUTE): 0.1 10*3/uL (ref 0.0–0.4)
Eos: 2 %
Hematocrit: 46.6 % (ref 34.0–46.6)
Hemoglobin: 14.9 g/dL (ref 11.1–15.9)
Immature Grans (Abs): 0 10*3/uL (ref 0.0–0.1)
Immature Granulocytes: 0 %
Lymphocytes Absolute: 2.6 10*3/uL (ref 0.7–3.1)
Lymphs: 34 %
MCH: 31.2 pg (ref 26.6–33.0)
MCHC: 32 g/dL (ref 31.5–35.7)
MCV: 98 fL — ABNORMAL HIGH (ref 79–97)
Monocytes Absolute: 0.7 10*3/uL (ref 0.1–0.9)
Monocytes: 9 %
Neutrophils Absolute: 4.2 10*3/uL (ref 1.4–7.0)
Neutrophils: 54 %
Platelets: 314 10*3/uL (ref 150–450)
RBC: 4.78 x10E6/uL (ref 3.77–5.28)
RDW: 11.9 % (ref 11.7–15.4)
WBC: 7.7 10*3/uL (ref 3.4–10.8)

## 2023-01-31 LAB — LIPID PANEL
Chol/HDL Ratio: 4.2 {ratio} (ref 0.0–4.4)
Cholesterol, Total: 154 mg/dL (ref 100–199)
HDL: 37 mg/dL — ABNORMAL LOW (ref >39)
LDL Chol Calc (NIH): 86 mg/dL (ref 0–99)
Triglycerides: 177 mg/dL — ABNORMAL HIGH (ref 0–149)
VLDL Cholesterol Cal: 31 mg/dL (ref 5–40)

## 2023-01-31 LAB — B12 AND FOLATE PANEL
Folate: 3.6 ng/mL (ref >3.0)
Vitamin B-12: 588 pg/mL (ref 232–1245)

## 2023-01-31 LAB — CMP14+EGFR
ALT: 30 [IU]/L (ref 0–32)
AST: 26 [IU]/L (ref 0–40)
Albumin: 4.2 g/dL (ref 3.8–4.9)
Alkaline Phosphatase: 71 [IU]/L (ref 44–121)
BUN/Creatinine Ratio: 8 — ABNORMAL LOW (ref 9–23)
BUN: 7 mg/dL (ref 6–24)
Bilirubin Total: 0.5 mg/dL (ref 0.0–1.2)
CO2: 24 mmol/L (ref 20–29)
Calcium: 9.5 mg/dL (ref 8.7–10.2)
Chloride: 102 mmol/L (ref 96–106)
Creatinine, Ser: 0.86 mg/dL (ref 0.57–1.00)
Globulin, Total: 2.5 g/dL (ref 1.5–4.5)
Glucose: 125 mg/dL — ABNORMAL HIGH (ref 70–99)
Potassium: 4.5 mmol/L (ref 3.5–5.2)
Sodium: 140 mmol/L (ref 134–144)
Total Protein: 6.7 g/dL (ref 6.0–8.5)
eGFR: 80 mL/min/{1.73_m2} (ref >59)

## 2023-01-31 LAB — VITAMIN D 25 HYDROXY (VIT D DEFICIENCY, FRACTURES): Vit D, 25-Hydroxy: 42.1 ng/mL (ref 30.0–100.0)

## 2023-01-31 LAB — HGB A1C W/O EAG: Hgb A1c MFr Bld: 6.8 % — ABNORMAL HIGH (ref 4.8–5.6)

## 2023-02-05 NOTE — Progress Notes (Signed)
Labs have been reviewed. There are some abnormal labs but no critical values. Will discuss labs at her upcoming office visit in January.

## 2023-03-19 ENCOUNTER — Ambulatory Visit: Payer: Self-pay | Admitting: Nurse Practitioner

## 2023-03-19 ENCOUNTER — Encounter: Payer: Self-pay | Admitting: Nurse Practitioner

## 2023-03-19 VITALS — BP 136/86 | HR 73 | Temp 98.3°F | Resp 16 | Ht 65.0 in | Wt 207.2 lb

## 2023-03-19 DIAGNOSIS — F411 Generalized anxiety disorder: Secondary | ICD-10-CM

## 2023-03-19 DIAGNOSIS — I10 Essential (primary) hypertension: Secondary | ICD-10-CM

## 2023-03-19 DIAGNOSIS — E1169 Type 2 diabetes mellitus with other specified complication: Secondary | ICD-10-CM

## 2023-03-19 DIAGNOSIS — E782 Mixed hyperlipidemia: Secondary | ICD-10-CM

## 2023-03-19 DIAGNOSIS — E785 Hyperlipidemia, unspecified: Secondary | ICD-10-CM

## 2023-03-19 DIAGNOSIS — B372 Candidiasis of skin and nail: Secondary | ICD-10-CM | POA: Diagnosis not present

## 2023-03-19 DIAGNOSIS — Z79899 Other long term (current) drug therapy: Secondary | ICD-10-CM

## 2023-03-19 MED ORDER — METFORMIN HCL ER 500 MG PO TB24: 500.0000 mg | ORAL_TABLET | Freq: Every day | ORAL | 1 refills | Status: DC

## 2023-03-19 MED ORDER — AMLODIPINE BESYLATE 5 MG PO TABS: ORAL_TABLET | ORAL | 1 refills | Status: DC

## 2023-03-19 MED ORDER — ATORVASTATIN CALCIUM 10 MG PO TABS: 10.0000 mg | ORAL_TABLET | Freq: Every day | ORAL | 1 refills | Status: DC

## 2023-03-19 MED ORDER — OMEPRAZOLE 40 MG PO CPDR: DELAYED_RELEASE_CAPSULE | ORAL | 1 refills | Status: DC

## 2023-03-19 MED ORDER — ACCU-CHEK GUIDE ME W/DEVICE KIT: 1.0000 | PACK | Freq: Once | 0 refills | Status: AC

## 2023-03-19 MED ORDER — VITAMIN D (ERGOCALCIFEROL) 1.25 MG (50000 UNIT) PO CAPS: 50000.0000 [IU] | ORAL_CAPSULE | ORAL | 5 refills | Status: DC

## 2023-03-19 MED ORDER — ACCU-CHEK FASTCLIX LANCETS MISC: 11 refills | Status: AC

## 2023-03-19 MED ORDER — ACCU-CHEK GUIDE TEST VI STRP: ORAL_STRIP | 12 refills | Status: AC

## 2023-03-19 MED ORDER — MELOXICAM 15 MG PO TABS: ORAL_TABLET | ORAL | 1 refills | Status: DC

## 2023-03-19 MED ORDER — ALPRAZOLAM 0.25 MG PO TABS: 0.2500 mg | ORAL_TABLET | Freq: Two times a day (BID) | ORAL | 2 refills | Status: DC | PRN

## 2023-03-19 MED ORDER — CLOTRIMAZOLE-BETAMETHASONE 1-0.05 % EX CREA: 1.0000 | TOPICAL_CREAM | Freq: Every day | CUTANEOUS | 3 refills | Status: AC

## 2023-03-19 MED ORDER — ATENOLOL 25 MG PO TABS: ORAL_TABLET | ORAL | 1 refills | Status: DC

## 2023-03-19 NOTE — Progress Notes (Signed)
 [facility] [address] [place], [place] [address]  Internal MEDICINE  Office Visit Note  Patient Name: Jamie Sanchez  [id number]  [id number]  Date of Service: 03/19/2023  Chief Complaint  Patient presents with   Gastroesophageal Reflux   Hyperlipidemia   Follow-up    HPI Alene presents for a follow-up visit for diabetes, anxiety, rash, weight loss.  Yeast rash under breasts -- itchy and red  Anxiety -- takes alprazolam as needed, due for refills.  Weight loss -- lost 6 lbs since her last office visit Diabetes -- A1c is elevated at 6.8 and this is new for patient. Not on any medication for this yet.    Current Medication: Outpatient Encounter Medications as of 03/19/2023  Medication Sig   Accu-Chek FastClix Lancets MISC Use 1 lancet 3 times daily as needed to check glucose for diabetes E11.65   Blood Glucose Monitoring Suppl (ACCU-CHEK GUIDE ME) w/Device KIT 1 Device by Does not apply route once for 1 dose. Use device as directed   clotrimazole-betamethasone (LOTRISONE) cream Apply 1 Application topically daily. For rash until resolved   glucose blood (ACCU-CHEK GUIDE TEST) test strip Use 1 test strip 3 times daily as needed to check glucose for diabetes E11.65   metFORMIN (GLUCOPHAGE-XR) 500 MG 24 hr tablet Take 1 tablet (500 mg total) by mouth daily with breakfast.   [DISCONTINUED] ALPRAZolam (XANAX) 0.25 MG tablet Take 1 tablet (0.25 mg total) by mouth 2 (two) times daily as needed for anxiety.   [DISCONTINUED] amLODipine (NORVASC) 5 MG tablet TAKE 1 TABLET(5 MG) BY MOUTH DAILY   [DISCONTINUED] atenolol (TENORMIN) 25 MG tablet TAKE 1 TABLET(25 MG) BY MOUTH DAILY   [DISCONTINUED] atorvastatin (LIPITOR) 10 MG tablet Take 1 tablet (10 mg total) by mouth daily.   [DISCONTINUED] meloxicam (MOBIC) 15 MG tablet TAKE 1 TABLET(15 MG) BY MOUTH DAILY   [DISCONTINUED] omeprazole (PRILOSEC) 40 MG capsule TAKE 1 CAPSULE(40 MG) BY MOUTH DAILY   [DISCONTINUED] Vitamin D,  Ergocalciferol, (DRISDOL) 1.25 MG (50000 UNIT) CAPS capsule Take 1 capsule (50,000 Units total) by mouth every 7 (seven) days.   ALPRAZolam (XANAX) 0.25 MG tablet Take 1 tablet (0.25 mg total) by mouth 2 (two) times daily as needed for anxiety.   amLODipine (NORVASC) 5 MG tablet TAKE 1 TABLET(5 MG) BY MOUTH DAILY   atenolol (TENORMIN) 25 MG tablet TAKE 1 TABLET(25 MG) BY MOUTH DAILY   atorvastatin (LIPITOR) 10 MG tablet Take 1 tablet (10 mg total) by mouth daily.   meloxicam (MOBIC) 15 MG tablet TAKE 1 TABLET(15 MG) BY MOUTH DAILY   omeprazole (PRILOSEC) 40 MG capsule TAKE 1 CAPSULE(40 MG) BY MOUTH DAILY   Vitamin D, Ergocalciferol, (DRISDOL) 1.25 MG (50000 UNIT) CAPS capsule Take 1 capsule (50,000 Units total) by mouth every 7 (seven) days.   No facility-administered encounter medications on file as of 03/19/2023.    Surgical History: Past Surgical History:  Procedure Laterality Date   ABDOMINAL HYSTERECTOMY     APPENDECTOMY     INCONTINENCE SURGERY     KNEE ARTHROSCOPY Left     Medical History: Past Medical History:  Diagnosis Date   GERD (gastroesophageal reflux disease)    Hyperlipidemia     Family History: Family History  Problem Relation Age of Onset   Heart attack Mother    Hypertension Father    Kidney cancer Father    Hypertension Sister    Heart attack Sister    Hypertension Brother    Heart attack Brother  Social History   Socioeconomic History   Marital status: Married    Spouse name: Not on file   Number of children: Not on file   Years of education: Not on file   Highest education level: Not on file  Occupational History   Not on file  Tobacco Use   Smoking status: Every Day    Current packs/day: 0.50    Types: Cigarettes   Smokeless tobacco: Never  Vaping Use   Vaping status: Never Used  Substance and Sexual Activity   Alcohol use: Yes    Comment: occ   Drug use: Never   Sexual activity: Not on file  Other Topics Concern   Not on file   Social History Narrative   Not on file   Social Drivers of Health   Financial Resource Strain: Not on file  Food Insecurity: Not on file  Transportation Needs: Not on file  Physical Activity: Not on file  Stress: Not on file  Social Connections: Not on file  Intimate Partner Violence: Not on file      Review of Systems  Constitutional:  Positive for appetite change and fatigue. Negative for chills and unexpected weight change.  HENT:  Negative for congestion, rhinorrhea, sneezing and sore throat.   Eyes:  Negative for redness.  Respiratory: Negative.  Negative for cough, chest tightness, shortness of breath and wheezing.   Cardiovascular: Negative.  Negative for chest pain and palpitations.  Gastrointestinal: Negative.  Negative for abdominal pain, constipation, diarrhea, nausea and vomiting.  Endocrine: Positive for polydipsia and polyuria.  Genitourinary:  Negative for dysuria and frequency.  Musculoskeletal:  Negative for arthralgias, back pain, joint swelling and neck pain.  Skin:  Negative for rash.  Neurological:  Negative for tremors, numbness and headaches.  Hematological:  Negative for adenopathy. Does not bruise/bleed easily.  Psychiatric/Behavioral:  Negative for behavioral problems (Depression), sleep disturbance and suicidal ideas. The patient is not nervous/anxious.     Vital Signs: BP 136/86   Pulse 73   Temp 98.3 F (36.8 C)   Resp 16   Ht 5\' 5"  (1.651 m)   Wt 207 lb 3.2 oz (94 kg)   SpO2 97%   BMI 34.48 kg/m    Physical Exam Vitals reviewed.  Constitutional:      Appearance: Normal appearance. She is obese.  HENT:     Head: Normocephalic and atraumatic.  Cardiovascular:     Rate and Rhythm: Normal rate and regular rhythm.     Pulses: Normal pulses.     Heart sounds: Normal heart sounds. No murmur heard. Pulmonary:     Effort: Pulmonary effort is normal. No respiratory distress.     Breath sounds: Normal breath sounds.  Skin:    General:  Skin is warm and dry.     Capillary Refill: Capillary refill takes less than 2 seconds.  Neurological:     Mental Status: She is alert and oriented to person, place, and time.  Psychiatric:        Mood and Affect: Mood normal.        Behavior: Behavior normal.        Assessment/Plan: 1. Type 2 diabetes mellitus with other specified complication, without long-term current use of insulin (HCC) (Primary) Check fasting glucose daily and record them, bring glucose log to next office visit. Metformin prescribed and diet modifications discussed. Information given to patient. Follow up in 1 month - Blood Glucose Monitoring Suppl (ACCU-CHEK GUIDE ME) w/Device KIT; 1 Device by Does  not apply route once for 1 dose. Use device as directed  Dispense: 1 kit; Refill: 0 - glucose blood (ACCU-CHEK GUIDE TEST) test strip; Use 1 test strip 3 times daily as needed to check glucose for diabetes E11.65  Dispense: 100 each; Refill: 12 - Accu-Chek FastClix Lancets MISC; Use 1 lancet 3 times daily as needed to check glucose for diabetes E11.65  Dispense: 102 each; Refill: 11  2. Hyperlipidemia associated with type 2 diabetes mellitus (HCC) Continue atorvastatin as prescribed.  - atorvastatin (LIPITOR) 10 MG tablet; Take 1 tablet (10 mg total) by mouth daily.  Dispense: 90 tablet; Refill: 1  3. Essential hypertension Stable, continue amlodipine and atenolol as prescribed.  - amLODipine (NORVASC) 5 MG tablet; TAKE 1 TABLET(5 MG) BY MOUTH DAILY  Dispense: 90 tablet; Refill: 1 - atenolol (TENORMIN) 25 MG tablet; TAKE 1 TABLET(25 MG) BY MOUTH DAILY  Dispense: 90 tablet; Refill: 1  4. Cutaneous candidiasis Topical treatment prescribed. Use until resolved.  - clotrimazole-betamethasone (LOTRISONE) cream; Apply 1 Application topically daily. For rash until resolved  Dispense: 45 g; Refill: 3  5. Encounter for medication review Medication list reviewed, updated and refills ordered  - meloxicam (MOBIC) 15 MG tablet;  TAKE 1 TABLET(15 MG) BY MOUTH DAILY  Dispense: 90 tablet; Refill: 1 - omeprazole (PRILOSEC) 40 MG capsule; TAKE 1 CAPSULE(40 MG) BY MOUTH DAILY  Dispense: 90 capsule; Refill: 1 - Vitamin D, Ergocalciferol, (DRISDOL) 1.25 MG (50000 UNIT) CAPS capsule; Take 1 capsule (50,000 Units total) by mouth every 7 (seven) days.  Dispense: 4 capsule; Refill: 5  6. Generalized anxiety disorder Continue prn alprazolam as prescribed. Follow up in 3 months for additional refills.  - ALPRAZolam (XANAX) 0.25 MG tablet; Take 1 tablet (0.25 mg total) by mouth 2 (two) times daily as needed for anxiety.  Dispense: 60 tablet; Refill: 2   General Counseling: shawntelle ungar understanding of the findings of todays visit and agrees with plan of treatment. I have discussed any further diagnostic evaluation that may be needed or ordered today. We also reviewed her medications today. she has been encouraged to call the office with any questions or concerns that should arise related to todays visit.    No orders of the defined types were placed in this encounter.   Meds ordered this encounter  Medications   ALPRAZolam (XANAX) 0.25 MG tablet    Sig: Take 1 tablet (0.25 mg total) by mouth 2 (two) times daily as needed for anxiety.    Dispense:  60 tablet    Refill:  2    Refills ordered   metFORMIN (GLUCOPHAGE-XR) 500 MG 24 hr tablet    Sig: Take 1 tablet (500 mg total) by mouth daily with breakfast.    Dispense:  90 tablet    Refill:  1    Fill new script today   atorvastatin (LIPITOR) 10 MG tablet    Sig: Take 1 tablet (10 mg total) by mouth daily.    Dispense:  90 tablet    Refill:  1   amLODipine (NORVASC) 5 MG tablet    Sig: TAKE 1 TABLET(5 MG) BY MOUTH DAILY    Dispense:  90 tablet    Refill:  1   atenolol (TENORMIN) 25 MG tablet    Sig: TAKE 1 TABLET(25 MG) BY MOUTH DAILY    Dispense:  90 tablet    Refill:  1   meloxicam (MOBIC) 15 MG tablet    Sig: TAKE 1 TABLET(15 MG) BY MOUTH DAILY  Dispense:   90 tablet    Refill:  1   omeprazole (PRILOSEC) 40 MG capsule    Sig: TAKE 1 CAPSULE(40 MG) BY MOUTH DAILY    Dispense:  90 capsule    Refill:  1   Vitamin D, Ergocalciferol, (DRISDOL) 1.25 MG (50000 UNIT) CAPS capsule    Sig: Take 1 capsule (50,000 Units total) by mouth every 7 (seven) days.    Dispense:  4 capsule    Refill:  5   clotrimazole-betamethasone (LOTRISONE) cream    Sig: Apply 1 Application topically daily. For rash until resolved    Dispense:  45 g    Refill:  3   Blood Glucose Monitoring Suppl (ACCU-CHEK GUIDE ME) w/Device KIT    Sig: 1 Device by Does not apply route once for 1 dose. Use device as directed    Dispense:  1 kit    Refill:  0    Dx code E11.65   glucose blood (ACCU-CHEK GUIDE TEST) test strip    Sig: Use 1 test strip 3 times daily as needed to check glucose for diabetes E11.65    Dispense:  100 each    Refill:  12   Accu-Chek FastClix Lancets MISC    Sig: Use 1 lancet 3 times daily as needed to check glucose for diabetes E11.65    Dispense:  102 each    Refill:  11    Return in about 4 weeks (around 04/16/2023) for F/U.   Total time spent:30 Minutes Time spent includes review of chart, medications, test results, and follow up plan with the patient.   [place] Controlled Substance Database was reviewed by me.  This patient was seen by Sallyanne Kuster, FNP-C in collaboration with Dr. Beverely Risen as a part of collaborative care agreement.   Tanisia Yokley R. Tedd Sias, MSN, FNP-C Internal medicine

## 2023-03-27 ENCOUNTER — Encounter: Payer: Self-pay | Admitting: Nurse Practitioner

## 2023-04-16 ENCOUNTER — Ambulatory Visit: Payer: No Typology Code available for payment source | Admitting: Nurse Practitioner

## 2023-04-16 ENCOUNTER — Encounter: Payer: Self-pay | Admitting: Nurse Practitioner

## 2023-04-16 VITALS — BP 130/75 | HR 76 | Temp 98.2°F | Resp 16 | Ht 65.0 in | Wt 199.2 lb

## 2023-04-16 DIAGNOSIS — E782 Mixed hyperlipidemia: Secondary | ICD-10-CM | POA: Diagnosis not present

## 2023-04-16 DIAGNOSIS — I10 Essential (primary) hypertension: Secondary | ICD-10-CM | POA: Diagnosis not present

## 2023-04-16 DIAGNOSIS — F411 Generalized anxiety disorder: Secondary | ICD-10-CM

## 2023-04-16 DIAGNOSIS — E1169 Type 2 diabetes mellitus with other specified complication: Secondary | ICD-10-CM

## 2023-04-16 NOTE — Progress Notes (Signed)
 [facility] [address] [place], [place] [address]  Internal MEDICINE  Office Visit Note  Patient Name: Jamie Sanchez  [id number]  [id number]  Date of Service: 04/16/2023  Chief Complaint  Patient presents with   Hyperlipidemia   Gastroesophageal Reflux   Follow-up    HPI Jamie Sanchez presents for a follow-up visit for diabetes, hypertension, and weight loss.  Diabetes -- has been checking glucose 2-3 times daily and her numbers have been within range most of the time. Did not start the metformin. Wants to control glucose level with diet for now.  Hypertension -- slightly elevated today, rechecked  Weight loss -- has lost 14 lbs since November.    Current Medication: Outpatient Encounter Medications as of 04/16/2023  Medication Sig   Accu-Chek FastClix Lancets MISC Use 1 lancet 3 times daily as needed to check glucose for diabetes E11.65   ALPRAZolam (XANAX) 0.25 MG tablet Take 1 tablet (0.25 mg total) by mouth 2 (two) times daily as needed for anxiety.   amLODipine (NORVASC) 5 MG tablet TAKE 1 TABLET(5 MG) BY MOUTH DAILY   atenolol (TENORMIN) 25 MG tablet TAKE 1 TABLET(25 MG) BY MOUTH DAILY   atorvastatin (LIPITOR) 10 MG tablet Take 1 tablet (10 mg total) by mouth daily.   clotrimazole-betamethasone (LOTRISONE) cream Apply 1 Application topically daily. For rash until resolved   glucose blood (ACCU-CHEK GUIDE TEST) test strip Use 1 test strip 3 times daily as needed to check glucose for diabetes E11.65   meloxicam (MOBIC) 15 MG tablet TAKE 1 TABLET(15 MG) BY MOUTH DAILY   omeprazole (PRILOSEC) 40 MG capsule TAKE 1 CAPSULE(40 MG) BY MOUTH DAILY   Vitamin D, Ergocalciferol, (DRISDOL) 1.25 MG (50000 UNIT) CAPS capsule Take 1 capsule (50,000 Units total) by mouth every 7 (seven) days.   [DISCONTINUED] metFORMIN (GLUCOPHAGE-XR) 500 MG 24 hr tablet Take 1 tablet (500 mg total) by mouth daily with breakfast.   No facility-administered encounter medications on file as of  04/16/2023.    Surgical History: Past Surgical History:  Procedure Laterality Date   ABDOMINAL HYSTERECTOMY     APPENDECTOMY     INCONTINENCE SURGERY     KNEE ARTHROSCOPY Left     Medical History: Past Medical History:  Diagnosis Date   GERD (gastroesophageal reflux disease)    Hyperlipidemia     Family History: Family History  Problem Relation Age of Onset   Heart attack Mother    Hypertension Father    Kidney cancer Father    Hypertension Sister    Heart attack Sister    Hypertension Brother    Heart attack Brother     Social History   Socioeconomic History   Marital status: Married    Spouse name: Not on file   Number of children: Not on file   Years of education: Not on file   Highest education level: Not on file  Occupational History   Not on file  Tobacco Use   Smoking status: Every Day    Current packs/day: 0.50    Types: Cigarettes   Smokeless tobacco: Never  Vaping Use   Vaping status: Never Used  Substance and Sexual Activity   Alcohol use: Yes    Comment: occ   Drug use: Never   Sexual activity: Not on file  Other Topics Concern   Not on file  Social History Narrative   Not on file   Social Drivers of Health   Financial Resource Strain: Not on file  Food Insecurity:  Not on file  Transportation Needs: Not on file  Physical Activity: Not on file  Stress: Not on file  Social Connections: Not on file  Intimate Partner Violence: Not on file      Review of Systems  Constitutional:  Negative for chills, fatigue and unexpected weight change.  HENT:  Negative for congestion, rhinorrhea, sneezing and sore throat.   Eyes:  Negative for redness.  Respiratory: Negative.  Negative for cough, chest tightness, shortness of breath and wheezing.   Cardiovascular: Negative.  Negative for chest pain and palpitations.  Gastrointestinal:  Negative for abdominal pain, constipation, diarrhea, nausea and vomiting.  Genitourinary:  Negative for dysuria  and frequency.  Musculoskeletal:  Negative for arthralgias, back pain, joint swelling and neck pain.  Skin:  Negative for rash.  Neurological: Negative.  Negative for tremors and numbness.  Hematological:  Negative for adenopathy. Does not bruise/bleed easily.  Psychiatric/Behavioral:  Negative for behavioral problems (Depression), sleep disturbance and suicidal ideas. The patient is not nervous/anxious.     Vital Signs: BP 130/75 Comment: 140/90  Pulse 76   Temp 98.2 F (36.8 C)   Resp 16   Ht 5\' 5"  (1.651 m)   Wt 199 lb 3.2 oz (90.4 kg)   SpO2 95%   BMI 33.15 kg/m    Physical Exam Vitals reviewed.  Constitutional:      Appearance: Normal appearance. She is obese.  HENT:     Head: Normocephalic and atraumatic.  Cardiovascular:     Rate and Rhythm: Normal rate and regular rhythm.     Pulses: Normal pulses.     Heart sounds: Normal heart sounds. No murmur heard. Pulmonary:     Effort: Pulmonary effort is normal. No respiratory distress.     Breath sounds: Normal breath sounds.  Skin:    General: Skin is warm and dry.     Capillary Refill: Capillary refill takes less than 2 seconds.  Neurological:     Mental Status: She is alert and oriented to person, place, and time.  Psychiatric:        Mood and Affect: Mood normal.        Behavior: Behavior normal.        Assessment/Plan: 1. Type 2 diabetes mellitus with other specified complication, without long-term current use of insulin (HCC) (Primary) Metformin discontinued. Continue diet and lifestyle changes as previously discussed.   2. Essential hypertension Stable, continue atenolol and amlodipine as prescribed.   3. Mixed hyperlipidemia Continue atorvastatin as prescribed.   4. Generalized anxiety disorder Continue prn alprazolam as prescribed, no refills due at this time.    General Counseling: Jamie Sanchez understanding of the findings of todays visit and agrees with plan of treatment. I have discussed  any further diagnostic evaluation that may be needed or ordered today. We also reviewed her medications today. she has been encouraged to call the office with any questions or concerns that should arise related to todays visit.    No orders of the defined types were placed in this encounter.   No orders of the defined types were placed in this encounter.   Return in about 2 months (around 06/14/2023) for reschedule CPE for 2 months, will do anxiety med refills and a1c then as well. .   Total time spent:30 Minutes Time spent includes review of chart, medications, test results, and follow up plan with the patient.   [place] Controlled Substance Database was reviewed by me.  This patient was seen by Sallyanne Kuster, FNP-C  in collaboration with Dr. Beverely Risen as a part of collaborative care agreement.   Miran Kautzman R. Tedd Sias, MSN, FNP-C Internal medicine

## 2023-04-20 ENCOUNTER — Encounter: Payer: No Typology Code available for payment source | Admitting: Nurse Practitioner

## 2023-04-25 ENCOUNTER — Encounter: Payer: Self-pay | Admitting: Nurse Practitioner

## 2023-06-10 ENCOUNTER — Encounter: Payer: Self-pay | Admitting: Nurse Practitioner

## 2023-06-10 ENCOUNTER — Ambulatory Visit (INDEPENDENT_AMBULATORY_CARE_PROVIDER_SITE_OTHER): Payer: No Typology Code available for payment source | Admitting: Nurse Practitioner

## 2023-06-10 ENCOUNTER — Telehealth: Payer: Self-pay | Admitting: Nurse Practitioner

## 2023-06-10 VITALS — BP 134/88 | HR 73 | Temp 98.1°F | Resp 16 | Ht 65.0 in | Wt 190.2 lb

## 2023-06-10 DIAGNOSIS — F411 Generalized anxiety disorder: Secondary | ICD-10-CM

## 2023-06-10 DIAGNOSIS — E785 Hyperlipidemia, unspecified: Secondary | ICD-10-CM

## 2023-06-10 DIAGNOSIS — I5189 Other ill-defined heart diseases: Secondary | ICD-10-CM | POA: Diagnosis not present

## 2023-06-10 DIAGNOSIS — E1159 Type 2 diabetes mellitus with other circulatory complications: Secondary | ICD-10-CM | POA: Diagnosis not present

## 2023-06-10 DIAGNOSIS — E1169 Type 2 diabetes mellitus with other specified complication: Secondary | ICD-10-CM | POA: Diagnosis not present

## 2023-06-10 DIAGNOSIS — Z79899 Other long term (current) drug therapy: Secondary | ICD-10-CM

## 2023-06-10 DIAGNOSIS — Z0001 Encounter for general adult medical examination with abnormal findings: Secondary | ICD-10-CM | POA: Diagnosis not present

## 2023-06-10 DIAGNOSIS — I152 Hypertension secondary to endocrine disorders: Secondary | ICD-10-CM

## 2023-06-10 DIAGNOSIS — E119 Type 2 diabetes mellitus without complications: Secondary | ICD-10-CM | POA: Insufficient documentation

## 2023-06-10 DIAGNOSIS — Z8249 Family history of ischemic heart disease and other diseases of the circulatory system: Secondary | ICD-10-CM

## 2023-06-10 DIAGNOSIS — I071 Rheumatic tricuspid insufficiency: Secondary | ICD-10-CM

## 2023-06-10 DIAGNOSIS — I700 Atherosclerosis of aorta: Secondary | ICD-10-CM | POA: Diagnosis not present

## 2023-06-10 LAB — POCT GLYCOSYLATED HEMOGLOBIN (HGB A1C): Hemoglobin A1C: 5.6 % (ref 4.0–5.6)

## 2023-06-10 MED ORDER — AMLODIPINE BESYLATE 5 MG PO TABS: ORAL_TABLET | ORAL | 1 refills | Status: DC

## 2023-06-10 MED ORDER — ATENOLOL 25 MG PO TABS: ORAL_TABLET | ORAL | 1 refills | Status: DC

## 2023-06-10 MED ORDER — OMEPRAZOLE 40 MG PO CPDR: DELAYED_RELEASE_CAPSULE | ORAL | 1 refills | Status: DC

## 2023-06-10 MED ORDER — ATORVASTATIN CALCIUM 10 MG PO TABS: 10.0000 mg | ORAL_TABLET | Freq: Every day | ORAL | 1 refills | Status: DC

## 2023-06-10 MED ORDER — ALPRAZOLAM 0.25 MG PO TABS
0.2500 mg | ORAL_TABLET | Freq: Two times a day (BID) | ORAL | 2 refills | Status: DC | PRN
Start: 2023-06-10 — End: 2023-08-13

## 2023-06-10 NOTE — Telephone Encounter (Signed)
 PA request for echo faxed to [facility]; [phone]

## 2023-06-10 NOTE — Progress Notes (Signed)
 [facility] [address] [place], [place] [address]  Internal MEDICINE  Office Visit Note  Patient Name: Jamie Sanchez  [id number]  [id number]  Date of Service: 06/10/2023  Chief Complaint  Patient presents with   Gastroesophageal Reflux   Hyperlipidemia   Annual Exam    HPI Jamie Sanchez presents for an annual well visit and physical exam.  Well-appearing 53 y.o. female with hypertension, diabetes, GERD, GAD, low vitamin D , high cholesterol, aortic atherosclerosis, diastolic dysfunction, and a family history of heart disease.  Routine CRC screening: colonoscopy due in 2027 Routine mammogram: due in November this year DEXA scan: due in 10 years  Eye exam: no issues, will need to establish with eye doctor in the next year if not already.  Foot exam: done  Labs: labs done in December last year, up to date. A1c rechecked today and is improved and in normal range now.  New or worsening pain: some arthritis, nothing significant.  Other concerns: none     Current Medication: Outpatient Encounter Medications as of 06/10/2023  Medication Sig   Accu-Chek FastClix Lancets MISC Use 1 lancet 3 times daily as needed to check glucose for diabetes E11.65   clotrimazole -betamethasone  (LOTRISONE ) cream Apply 1 Application topically daily. For rash until resolved   glucose blood (ACCU-CHEK GUIDE TEST) test strip Use 1 test strip 3 times daily as needed to check glucose for diabetes E11.65   Vitamin D , Ergocalciferol , (DRISDOL ) 1.25 MG (50000 UNIT) CAPS capsule Take 1 capsule (50,000 Units total) by mouth every 7 (seven) days.   [DISCONTINUED] ALPRAZolam  (XANAX ) 0.25 MG tablet Take 1 tablet (0.25 mg total) by mouth 2 (two) times daily as needed for anxiety.   [DISCONTINUED] amLODipine  (NORVASC ) 5 MG tablet TAKE 1 TABLET(5 MG) BY MOUTH DAILY   [DISCONTINUED] atenolol  (TENORMIN ) 25 MG tablet TAKE 1 TABLET(25 MG) BY MOUTH DAILY   [DISCONTINUED] atorvastatin  (LIPITOR) 10 MG tablet Take 1  tablet (10 mg total) by mouth daily.   [DISCONTINUED] meloxicam  (MOBIC ) 15 MG tablet TAKE 1 TABLET(15 MG) BY MOUTH DAILY   [DISCONTINUED] omeprazole  (PRILOSEC) 40 MG capsule TAKE 1 CAPSULE(40 MG) BY MOUTH DAILY   ALPRAZolam  (XANAX ) 0.25 MG tablet Take 1 tablet (0.25 mg total) by mouth 2 (two) times daily as needed for anxiety.   amLODipine  (NORVASC ) 5 MG tablet TAKE 1 TABLET(5 MG) BY MOUTH DAILY   atenolol  (TENORMIN ) 25 MG tablet TAKE 1 TABLET(25 MG) BY MOUTH DAILY   atorvastatin  (LIPITOR) 10 MG tablet Take 1 tablet (10 mg total) by mouth daily.   omeprazole  (PRILOSEC) 40 MG capsule TAKE 1 CAPSULE(40 MG) BY MOUTH DAILY   No facility-administered encounter medications on file as of 06/10/2023.    Surgical History: Past Surgical History:  Procedure Laterality Date   ABDOMINAL HYSTERECTOMY     APPENDECTOMY     INCONTINENCE SURGERY     KNEE ARTHROSCOPY Left     Medical History: Past Medical History:  Diagnosis Date   GERD (gastroesophageal reflux disease)    Hyperlipidemia     Family History: Family History  Problem Relation Age of Onset   Heart attack Mother    Hypertension Father    Kidney cancer Father    Hypertension Sister    Heart attack Sister    Hypertension Brother    Heart attack Brother     Social History   Socioeconomic History   Marital status: Married    Spouse name: Not on file   Number of children: Not on file  Years of education: Not on file   Highest education level: Not on file  Occupational History   Not on file  Tobacco Use   Smoking status: Every Day    Current packs/day: 0.50    Types: Cigarettes   Smokeless tobacco: Never  Vaping Use   Vaping status: Never Used  Substance and Sexual Activity   Alcohol use: Yes    Comment: occ   Drug use: Never   Sexual activity: Not on file  Other Topics Concern   Not on file  Social History Narrative   Not on file   Social Drivers of Health   Financial Resource Strain: Not on file  Food  Insecurity: Not on file  Transportation Needs: Not on file  Physical Activity: Not on file  Stress: Not on file  Social Connections: Not on file  Intimate Partner Violence: Not on file      Review of Systems  Constitutional:  Negative for activity change, appetite change, chills, fatigue, fever and unexpected weight change.  HENT: Negative.  Negative for congestion, ear pain, rhinorrhea, sore throat and trouble swallowing.   Eyes: Negative.   Respiratory: Negative.  Negative for cough, chest tightness, shortness of breath and wheezing.   Cardiovascular: Negative.  Negative for chest pain.  Gastrointestinal: Negative.  Negative for abdominal pain, blood in stool, constipation, diarrhea, nausea and vomiting.  Endocrine: Negative.   Genitourinary: Negative.  Negative for difficulty urinating, dysuria, frequency, hematuria and urgency.  Musculoskeletal: Negative.  Negative for arthralgias, back pain, joint swelling, myalgias and neck pain.  Skin: Negative.  Negative for rash and wound.  Allergic/Immunologic: Negative.  Negative for immunocompromised state.  Neurological: Negative.  Negative for dizziness, seizures, numbness and headaches.  Hematological: Negative.   Psychiatric/Behavioral: Negative.  Negative for behavioral problems, self-injury and suicidal ideas. The patient is not nervous/anxious.     Vital Signs: BP 134/88   Pulse 73   Temp 98.1 F (36.7 C)   Resp 16   Ht 5\' 5"  (1.651 m)   Wt 190 lb 3.2 oz (86.3 kg)   SpO2 97%   BMI 31.65 kg/m    Physical Exam Vitals reviewed.  Constitutional:      General: She is awake. She is not in acute distress.    Appearance: Normal appearance. She is well-developed and well-groomed. She is obese. She is not ill-appearing or diaphoretic.  HENT:     Head: Normocephalic and atraumatic.     Right Ear: Tympanic membrane, ear canal and external ear normal.     Left Ear: Tympanic membrane, ear canal and external ear normal.     Nose:  Nose normal. No congestion or rhinorrhea.     Mouth/Throat:     Lips: Pink.     Mouth: Mucous membranes are moist.     Pharynx: Oropharynx is clear. Uvula midline. No oropharyngeal exudate or posterior oropharyngeal erythema.  Eyes:     General: Lids are normal. Vision grossly intact. Gaze aligned appropriately. No scleral icterus.       Right eye: No discharge.        Left eye: No discharge.     Extraocular Movements: Extraocular movements intact.     Conjunctiva/sclera: Conjunctivae normal.     Pupils: Pupils are equal, round, and reactive to light.     Funduscopic exam:    Right eye: Red reflex present.        Left eye: Red reflex present. Neck:     Thyroid : No thyromegaly.  Vascular: No JVD.     Trachea: Trachea and phonation normal. No tracheal deviation.  Cardiovascular:     Rate and Rhythm: Normal rate and regular rhythm.     Pulses: Normal pulses.          Dorsalis pedis pulses are 2+ on the right side and 2+ on the left side.       Posterior tibial pulses are 2+ on the right side and 2+ on the left side.     Heart sounds: Normal heart sounds, S1 normal and S2 normal. No murmur heard.    No friction rub. No gallop.  Pulmonary:     Effort: Pulmonary effort is normal. No accessory muscle usage or respiratory distress.     Breath sounds: Normal breath sounds and air entry. No stridor. No wheezing or rales.  Chest:     Chest wall: No tenderness.     Comments: Declined clinical breast exam, gets annual mammograms.  Abdominal:     General: Bowel sounds are normal. There is no distension.     Palpations: Abdomen is soft. There is no shifting dullness, fluid wave, mass or pulsatile mass.     Tenderness: There is no abdominal tenderness. There is no guarding or rebound.  Musculoskeletal:        General: No tenderness or deformity. Normal range of motion.     Cervical back: Normal range of motion and neck supple.     Right lower leg: No edema.     Left lower leg: No edema.      Right foot: Normal range of motion. No deformity, bunion, Charcot foot, foot drop or prominent metatarsal heads.     Left foot: Normal range of motion. No deformity, bunion, Charcot foot, foot drop or prominent metatarsal heads.  Feet:     Right foot:     Protective Sensation: 6 sites tested.  6 sites sensed.     Skin integrity: Skin integrity normal.     Toenail Condition: Right toenails are normal.     Left foot:     Protective Sensation: 6 sites tested.  6 sites sensed.     Skin integrity: Skin integrity normal.     Toenail Condition: Left toenails are normal.  Lymphadenopathy:     Cervical: No cervical adenopathy.  Skin:    General: Skin is warm and dry.     Capillary Refill: Capillary refill takes less than 2 seconds.     Coloration: Skin is not pale.     Findings: No erythema or rash.  Neurological:     Mental Status: She is alert and oriented to person, place, and time.     Cranial Nerves: No cranial nerve deficit.     Motor: No abnormal muscle tone.     Coordination: Coordination normal.     Gait: Gait normal.     Deep Tendon Reflexes: Reflexes are normal and symmetric.  Psychiatric:        Mood and Affect: Mood and affect normal.        Behavior: Behavior normal. Behavior is cooperative.        Thought Content: Thought content normal.        Judgment: Judgment normal.        Assessment/Plan: 1. Encounter for routine adult health examination with abnormal findings (Primary) Age-appropriate preventive screenings and vaccinations discussed, annual physical exam completed. Routine labs for health maintenance recently done in December, will repeat labs later this year. PHM updated.    2.  Type 2 diabetes mellitus with other specified complication, without long-term current use of insulin (HCC) A1c is improved to normal range. And urine sent for microalbumin/creatinine ratio. Continue diet and lifestyle modifications. May decrease to checking glucose once or twice weekly.   - POCT glycosylated hemoglobin (Hb A1C) - Urine Microalbumin w/creat. ratio  3. Hypertension associated with diabetes (HCC) Stable, continue amlodipine  and atenolol  as prescribed.  - amLODipine  (NORVASC ) 5 MG tablet; TAKE 1 TABLET(5 MG) BY MOUTH DAILY  Dispense: 90 tablet; Refill: 1 - atenolol  (TENORMIN ) 25 MG tablet; TAKE 1 TABLET(25 MG) BY MOUTH DAILY  Dispense: 90 tablet; Refill: 1  4. Hyperlipidemia associated with type 2 diabetes mellitus (HCC) Continue atorvastatin  as prescribed  - atorvastatin  (LIPITOR) 10 MG tablet; Take 1 tablet (10 mg total) by mouth daily.  Dispense: 90 tablet; Refill: 1  5. Aortic atherosclerosis (HCC) Echocardiogram ordered - ECHOCARDIOGRAM COMPLETE; Future  6. Diastolic dysfunction Echocardiogram ordered - ECHOCARDIOGRAM COMPLETE; Future  7. Trace tricuspid regurgitation by prior echocardiogram Echocardiogram ordered - ECHOCARDIOGRAM COMPLETE; Future  8. Family history of heart disease Echocardiogram ordered - ECHOCARDIOGRAM COMPLETE; Future  9. Encounter for medication review Medication list reviewed, updated and refills ordered  - omeprazole  (PRILOSEC) 40 MG capsule; TAKE 1 CAPSULE(40 MG) BY MOUTH DAILY  Dispense: 90 capsule; Refill: 1  10. Generalized anxiety disorder Continue alprazolam  as prescribed. Follow up in 3 months for additional refills.  - ALPRAZolam  (XANAX ) 0.25 MG tablet; Take 1 tablet (0.25 mg total) by mouth 2 (two) times daily as needed for anxiety.  Dispense: 60 tablet; Refill: 2     General Counseling: nyimah shadduck understanding of the findings of todays visit and agrees with plan of treatment. I have discussed any further diagnostic evaluation that may be needed or ordered today. We also reviewed her medications today. she has been encouraged to call the office with any questions or concerns that should arise related to todays visit.    Orders Placed This Encounter  Procedures   Urine Microalbumin w/creat. ratio    POCT glycosylated hemoglobin (Hb A1C)   ECHOCARDIOGRAM COMPLETE    Meds ordered this encounter  Medications   ALPRAZolam  (XANAX ) 0.25 MG tablet    Sig: Take 1 tablet (0.25 mg total) by mouth 2 (two) times daily as needed for anxiety.    Dispense:  60 tablet    Refill:  2    Refills ordered   amLODipine  (NORVASC ) 5 MG tablet    Sig: TAKE 1 TABLET(5 MG) BY MOUTH DAILY    Dispense:  90 tablet    Refill:  1   atenolol  (TENORMIN ) 25 MG tablet    Sig: TAKE 1 TABLET(25 MG) BY MOUTH DAILY    Dispense:  90 tablet    Refill:  1   atorvastatin  (LIPITOR) 10 MG tablet    Sig: Take 1 tablet (10 mg total) by mouth daily.    Dispense:  90 tablet    Refill:  1   omeprazole  (PRILOSEC) 40 MG capsule    Sig: TAKE 1 CAPSULE(40 MG) BY MOUTH DAILY    Dispense:  90 capsule    Refill:  1    Return in about 3 months (around 09/01/2023) for F/U, anxiety med refill, Melitta Tigue PCP.   Total time spent:30 Minutes Time spent includes review of chart, medications, test results, and follow up plan with the patient.   [place] Controlled Substance Database was reviewed by me.  This patient was seen by Laurence Pons, FNP-C in collaboration with Dr.  Verneta Gone as a part of collaborative care agreement.  Jearld Hemp R. Bobbi Burow, MSN, FNP-C Internal medicine

## 2023-06-11 LAB — MICROALBUMIN / CREATININE URINE RATIO
Creatinine, Urine: 233.4 mg/dL
Microalb/Creat Ratio: 6 mg/g{creat} (ref 0–29)
Microalbumin, Urine: 13.2 ug/mL

## 2023-07-28 ENCOUNTER — Ambulatory Visit
Admission: RE | Admit: 2023-07-28 | Discharge: 2023-07-28 | Disposition: A | Discharge status: Home / Self Care | Payer: Self-pay | Source: Ambulatory Visit | Attending: Nurse Practitioner | Admitting: Nurse Practitioner

## 2023-07-28 DIAGNOSIS — I119 Hypertensive heart disease without heart failure: Secondary | ICD-10-CM | POA: Insufficient documentation

## 2023-07-28 DIAGNOSIS — Z8249 Family history of ischemic heart disease and other diseases of the circulatory system: Secondary | ICD-10-CM | POA: Insufficient documentation

## 2023-07-28 DIAGNOSIS — I5189 Other ill-defined heart diseases: Secondary | ICD-10-CM

## 2023-07-28 DIAGNOSIS — E119 Type 2 diabetes mellitus without complications: Secondary | ICD-10-CM | POA: Insufficient documentation

## 2023-07-28 DIAGNOSIS — R008 Other abnormalities of heart beat: Secondary | ICD-10-CM | POA: Diagnosis not present

## 2023-07-28 DIAGNOSIS — I071 Rheumatic tricuspid insufficiency: Secondary | ICD-10-CM | POA: Diagnosis present

## 2023-07-28 DIAGNOSIS — I700 Atherosclerosis of aorta: Secondary | ICD-10-CM | POA: Insufficient documentation

## 2023-07-28 LAB — ECHOCARDIOGRAM COMPLETE
AR max vel: 1.64 cm2
AV Area VTI: 1.79 cm2
AV Area mean vel: 1.72 cm2
AV Mean grad: 5.7 mmHg
AV Peak grad: 10.1 mmHg
Ao pk vel: 1.59 m/s
Area-P 1/2: 3.28 cm2
MV VTI: 2.4 cm2
S' Lateral: 3.1 cm

## 2023-07-28 NOTE — Progress Notes (Signed)
*  PRELIMINARY RESULTS* Echocardiogram 2D Echocardiogram has been performed.  Broadus Canes 07/28/2023, 9:30 AM

## 2023-08-13 ENCOUNTER — Encounter: Payer: Self-pay | Admitting: Nurse Practitioner

## 2023-08-13 ENCOUNTER — Ambulatory Visit (INDEPENDENT_AMBULATORY_CARE_PROVIDER_SITE_OTHER): Admitting: Nurse Practitioner

## 2023-08-13 VITALS — BP 132/84 | HR 79 | Temp 98.3°F | Resp 16 | Ht 65.0 in | Wt 182.4 lb

## 2023-08-13 DIAGNOSIS — R319 Hematuria, unspecified: Secondary | ICD-10-CM

## 2023-08-13 DIAGNOSIS — R300 Dysuria: Secondary | ICD-10-CM | POA: Diagnosis not present

## 2023-08-13 DIAGNOSIS — Z800 Family history of malignant neoplasm of digestive organs: Secondary | ICD-10-CM

## 2023-08-13 DIAGNOSIS — B379 Candidiasis, unspecified: Secondary | ICD-10-CM

## 2023-08-13 DIAGNOSIS — R109 Unspecified abdominal pain: Secondary | ICD-10-CM

## 2023-08-13 DIAGNOSIS — N390 Urinary tract infection, site not specified: Secondary | ICD-10-CM | POA: Diagnosis not present

## 2023-08-13 DIAGNOSIS — F411 Generalized anxiety disorder: Secondary | ICD-10-CM

## 2023-08-13 DIAGNOSIS — E1169 Type 2 diabetes mellitus with other specified complication: Secondary | ICD-10-CM

## 2023-08-13 LAB — POCT URINALYSIS DIPSTICK
Bilirubin, UA: NEGATIVE
Glucose, UA: NEGATIVE
Leukocytes, UA: NEGATIVE
Nitrite, UA: NEGATIVE
Protein, UA: NEGATIVE
Spec Grav, UA: 1.01 (ref 1.010–1.025)
Urobilinogen, UA: 0.2 U/dL
pH, UA: 5 (ref 5.0–8.0)

## 2023-08-13 MED ORDER — ALPRAZOLAM 0.25 MG PO TABS: 0.2500 mg | ORAL_TABLET | Freq: Two times a day (BID) | ORAL | 2 refills | Status: DC | PRN

## 2023-08-13 MED ORDER — CIPROFLOXACIN HCL 500 MG PO TABS: 500.0000 mg | ORAL_TABLET | Freq: Two times a day (BID) | ORAL | 0 refills | Status: AC

## 2023-08-13 MED ORDER — CYCLOBENZAPRINE HCL 10 MG PO TABS: 10.0000 mg | ORAL_TABLET | Freq: Every evening | ORAL | 1 refills | Status: DC | PRN

## 2023-08-13 MED ORDER — FLUCONAZOLE 150 MG PO TABS: 150.0000 mg | ORAL_TABLET | Freq: Once | ORAL | 0 refills | Status: AC

## 2023-08-13 NOTE — Progress Notes (Signed)
 [facility] [address] [place], [place] [address]  Internal MEDICINE  Office Visit Note  Patient Name: Jamie Sanchez  [id number]  [id number]  Date of Service: 08/13/2023  Chief Complaint  Patient presents with   Gastroesophageal Reflux   Hyperlipidemia   Follow-up    HPI Jamie Sanchez presents for a follow-up visit for symptoms of uti, anxiety and echocardiogram results. Dysuria, back pain, urgency, frequency, urinary discomfort.  Anxiety -- taking alprazolam  as needed, refills due now.  Echocardiogram reviewed with patient -- grossly normal, no leaky valves, no valve stenosis, EF is normal  The 10-year ASCVD risk score (Arnett DK, et al., 2019) is: 14.2%   Values used to calculate the score:     Age: 53 years     Clincally relevant sex: Female     Is Non-Hispanic African American: No     Diabetic: Yes     Tobacco smoker: Yes     Systolic Blood Pressure: 132 mmHg     Is BP treated: Yes     HDL Cholesterol: 37 mg/dL     Total Cholesterol: 154 mg/dL   Current Medication: Outpatient Encounter Medications as of 08/13/2023  Medication Sig   ciprofloxacin (CIPRO) 500 MG tablet Take 1 tablet (500 mg total) by mouth 2 (two) times daily for 5 days. Take with food   cyclobenzaprine (FLEXERIL) 10 MG tablet Take 1 tablet (10 mg total) by mouth at bedtime as needed for muscle spasms.   fluconazole  (DIFLUCAN ) 150 MG tablet Take 1 tablet (150 mg total) by mouth once for 1 dose. May take an additional dose after 3 days if still symptomatic.   Accu-Chek FastClix Lancets MISC Use 1 lancet 3 times daily as needed to check glucose for diabetes E11.65   ALPRAZolam  (XANAX ) 0.25 MG tablet Take 1 tablet (0.25 mg total) by mouth 2 (two) times daily as needed for anxiety.   amLODipine  (NORVASC ) 5 MG tablet TAKE 1 TABLET(5 MG) BY MOUTH DAILY   atenolol  (TENORMIN ) 25 MG tablet TAKE 1 TABLET(25 MG) BY MOUTH DAILY   atorvastatin  (LIPITOR) 10 MG tablet Take 1 tablet (10 mg total) by mouth  daily.   clotrimazole -betamethasone  (LOTRISONE ) cream Apply 1 Application topically daily. For rash until resolved   glucose blood (ACCU-CHEK GUIDE TEST) test strip Use 1 test strip 3 times daily as needed to check glucose for diabetes E11.65   omeprazole  (PRILOSEC) 40 MG capsule TAKE 1 CAPSULE(40 MG) BY MOUTH DAILY   Vitamin D , Ergocalciferol , (DRISDOL ) 1.25 MG (50000 UNIT) CAPS capsule Take 1 capsule (50,000 Units total) by mouth every 7 (seven) days.   [DISCONTINUED] ALPRAZolam  (XANAX ) 0.25 MG tablet Take 1 tablet (0.25 mg total) by mouth 2 (two) times daily as needed for anxiety.   No facility-administered encounter medications on file as of 08/13/2023.    Surgical History: Past Surgical History:  Procedure Laterality Date   ABDOMINAL HYSTERECTOMY     APPENDECTOMY     INCONTINENCE SURGERY     KNEE ARTHROSCOPY Left     Medical History: Past Medical History:  Diagnosis Date   Acute bronchitis 08/18/2018   Acute pain of right hip 06/24/2017   Acute upper respiratory infection 02/24/2020   Colitis 09/30/2017   GERD (gastroesophageal reflux disease)    Hyperlipidemia    Vaginal candida 08/18/2018    Family History: Family History  Problem Relation Age of Onset   Heart attack Mother    Hypertension Father    Kidney cancer Father    Hypertension  Sister    Heart attack Sister    Hypertension Brother    Heart attack Brother     Social History   Socioeconomic History   Marital status: Married    Spouse name: Not on file   Number of children: Not on file   Years of education: Not on file   Highest education level: Not on file  Occupational History   Not on file  Tobacco Use   Smoking status: Every Day    Current packs/day: 0.50    Types: Cigarettes   Smokeless tobacco: Never  Vaping Use   Vaping status: Never Used  Substance and Sexual Activity   Alcohol use: Yes    Comment: occ   Drug use: Never   Sexual activity: Not on file  Other Topics Concern   Not on  file  Social History Narrative   Not on file   Social Drivers of Health   Financial Resource Strain: Not on file  Food Insecurity: Not on file  Transportation Needs: Not on file  Physical Activity: Not on file  Stress: Not on file  Social Connections: Not on file  Intimate Partner Violence: Not on file      Review of Systems  Constitutional:  Negative for chills, fatigue and unexpected weight change.  HENT:  Negative for congestion, rhinorrhea, sneezing and sore throat.   Eyes:  Negative for redness.  Respiratory: Negative.  Negative for cough, chest tightness, shortness of breath and wheezing.   Cardiovascular: Negative.  Negative for chest pain and palpitations.  Gastrointestinal:  Negative for abdominal pain, constipation, diarrhea, nausea and vomiting.  Genitourinary:  Positive for decreased urine volume, difficulty urinating, dysuria, flank pain, frequency, pelvic pain and urgency.  Musculoskeletal:  Negative for arthralgias, back pain, joint swelling and neck pain.  Skin:  Negative for rash.  Neurological: Negative.  Negative for tremors and numbness.  Hematological:  Negative for adenopathy. Does not bruise/bleed easily.  Psychiatric/Behavioral:  Negative for behavioral problems (Depression), sleep disturbance and suicidal ideas. The patient is not nervous/anxious.     Vital Signs: BP 132/84   Pulse 79   Temp 98.3 F (36.8 C)   Resp 16   Ht 5' 5 (1.651 m)   Wt 182 lb 6.4 oz (82.7 kg)   SpO2 96%   BMI 30.35 kg/m    Physical Exam Vitals reviewed.  Constitutional:      Appearance: Normal appearance. She is obese.  HENT:     Head: Normocephalic and atraumatic.   Cardiovascular:     Rate and Rhythm: Normal rate and regular rhythm.     Pulses: Normal pulses.     Heart sounds: Normal heart sounds. No murmur heard. Pulmonary:     Effort: Pulmonary effort is normal. No respiratory distress.     Breath sounds: Normal breath sounds.  Abdominal:     Tenderness:  There is abdominal tenderness in the suprapubic area.   Skin:    General: Skin is warm and dry.     Capillary Refill: Capillary refill takes less than 2 seconds.   Neurological:     Mental Status: She is alert and oriented to person, place, and time.   Psychiatric:        Mood and Affect: Mood normal.        Behavior: Behavior normal.        Assessment/Plan: 1. Type 2 diabetes mellitus with other specified complication, without long-term current use of insulin (HCC) (Primary) Referred to ophthalmology for annual  diabetic eye exams.  - Ambulatory referral to Ophthalmology  2. Urinary tract infection with hematuria, site unspecified Urine culture sent. Cipro prescribed, take until gone  - CULTURE, URINE COMPREHENSIVE - ciprofloxacin (CIPRO) 500 MG tablet; Take 1 tablet (500 mg total) by mouth 2 (two) times daily for 5 days. Take with food  Dispense: 10 tablet; Refill: 0  3. Flank pain May take cyclobenzaprine as needed at bedtime.  - cyclobenzaprine (FLEXERIL) 10 MG tablet; Take 1 tablet (10 mg total) by mouth at bedtime as needed for muscle spasms.  Dispense: 30 tablet; Refill: 1  4. Dysuria Urinalysis positive for trace blood, urine sent for culture.  - POCT urinalysis dipstick  5. Antibiotic-induced yeast infection Fluconazole  prescribed  - fluconazole  (DIFLUCAN ) 150 MG tablet; Take 1 tablet (150 mg total) by mouth once for 1 dose. May take an additional dose after 3 days if still symptomatic.  Dispense: 3 tablet; Refill: 0  6. Family history of colon cancer Sister has colon cancer and is currently in treatment.   7. Generalized anxiety disorder Continue alprazolam  as prescribed. Follow up in 3 months for additional refills.  - ALPRAZolam  (XANAX ) 0.25 MG tablet; Take 1 tablet (0.25 mg total) by mouth 2 (two) times daily as needed for anxiety.  Dispense: 60 tablet; Refill: 2   General Counseling: Jamie Sanchez understanding of the findings of todays visit and  agrees with plan of treatment. I have discussed any further diagnostic evaluation that may be needed or ordered today. We also reviewed her medications today. she has been encouraged to call the office with any questions or concerns that should arise related to todays visit.    Orders Placed This Encounter  Procedures   CULTURE, URINE COMPREHENSIVE   Ambulatory referral to Ophthalmology   POCT urinalysis dipstick    Meds ordered this encounter  Medications   ALPRAZolam  (XANAX ) 0.25 MG tablet    Sig: Take 1 tablet (0.25 mg total) by mouth 2 (two) times daily as needed for anxiety.    Dispense:  60 tablet    Refill:  2    Refills ordered   ciprofloxacin (CIPRO) 500 MG tablet    Sig: Take 1 tablet (500 mg total) by mouth 2 (two) times daily for 5 days. Take with food    Dispense:  10 tablet    Refill:  0    Fill new script today   fluconazole  (DIFLUCAN ) 150 MG tablet    Sig: Take 1 tablet (150 mg total) by mouth once for 1 dose. May take an additional dose after 3 days if still symptomatic.    Dispense:  3 tablet    Refill:  0   cyclobenzaprine (FLEXERIL) 10 MG tablet    Sig: Take 1 tablet (10 mg total) by mouth at bedtime as needed for muscle spasms.    Dispense:  30 tablet    Refill:  1    Fill new script today    Return in about 3 months (around 11/04/2023) for F/U, anxiety med refill, Jamie Sanchez PCP.   Total time spent:30 Minutes Time spent includes review of chart, medications, test results, and follow up plan with the patient.   [place] Controlled Substance Database was reviewed by me.  This patient was seen by Jamie Maxin, FNP-C in collaboration with Dr. Sigrid Sanchez as a part of collaborative care agreement.   Jamie Sanchez R. Maxin, MSN, FNP-C Internal medicine

## 2023-08-17 ENCOUNTER — Telehealth: Payer: Self-pay | Admitting: Nurse Practitioner

## 2023-08-17 NOTE — Telephone Encounter (Signed)
 Ophthalmology referral sent via [facility] to [facility] per patient request.  Notified patient. Gave pt telephone# [phone]

## 2023-08-18 ENCOUNTER — Telehealth: Payer: Self-pay | Admitting: Nurse Practitioner

## 2023-08-18 LAB — CULTURE, URINE COMPREHENSIVE

## 2023-08-18 NOTE — Telephone Encounter (Signed)
 Ophthalmology appointment was 08/26/2023 w/ [facility]

## 2023-08-19 ENCOUNTER — Telehealth: Payer: Self-pay

## 2023-08-19 DIAGNOSIS — N390 Urinary tract infection, site not specified: Secondary | ICD-10-CM

## 2023-08-19 MED ORDER — AMPICILLIN 500 MG PO CAPS: 500.0000 mg | ORAL_CAPSULE | Freq: Four times a day (QID) | ORAL | 0 refills | Status: AC

## 2023-08-19 NOTE — Telephone Encounter (Signed)
 Patient notified

## 2023-09-15 ENCOUNTER — Encounter: Payer: Self-pay | Admitting: Ophthalmology

## 2023-09-15 ENCOUNTER — Ambulatory Visit: Admitting: Nurse Practitioner

## 2023-09-15 NOTE — Anesthesia Preprocedure Evaluation (Addendum)
 Anesthesia Evaluation  Patient identified by MRN, date of birth, ID band Patient awake    Reviewed: Allergy & Precautions, H&P , NPO status , Patient's Chart, lab work & pertinent test results  Airway Mallampati: III  TM Distance: >3 FB Neck ROM: Full    Dental no notable dental hx. (+) Upper Dentures   Pulmonary Current Smoker and Patient abstained from smoking.  Auscultates clear and slightly diminished if breathing normally, but definite smokers' cough and rhonchi when laughing or coughing  + rhonchi        Cardiovascular hypertension, Normal cardiovascular exam Rhythm:Regular Rate:Normal  07-28-23 echo 1. Left ventricular ejection fraction, by estimation, is 60 to 65%. The  left ventricle has normal function. The left ventricle has no regional  wall motion abnormalities. Left ventricular diastolic parameters were  normal.   2. Right ventricular systolic function is normal. The right ventricular  size is normal.   3. The mitral valve is normal in structure. No evidence of mitral valve  regurgitation. No evidence of mitral stenosis.   4. The aortic valve is normal in structure. Aortic valve regurgitation is  not visualized. No aortic stenosis is present.   5. The inferior vena cava is normal in size with greater than 50%  respiratory variability, suggesting right atrial pressure of 3 mmHg.      Neuro/Psych  PSYCHIATRIC DISORDERS Anxiety     negative neurological ROS  negative psych ROS   GI/Hepatic negative GI ROS, Neg liver ROS,GERD  ,,  Endo/Other  diabetes    Renal/GU negative Renal ROS  negative genitourinary   Musculoskeletal negative musculoskeletal ROS (+) Arthritis ,    Abdominal   Peds negative pediatric ROS (+)  Hematology negative hematology ROS (+)   Anesthesia Other Findings Hyperlipidemia  GERD (gastroesophageal reflux disease) Acute pain of right hip  Colitis Acute upper respiratory  infection Acute bronchitis Vaginal candida  Hypertension Arthritis  Diabetes mellitus without complication  Anxiety, takes alprazolam  prn   Reproductive/Obstetrics negative OB ROS                              Anesthesia Physical Anesthesia Plan  ASA: 3  Anesthesia Plan: MAC   Post-op Pain Management:    Induction: Intravenous  PONV Risk Score and Plan:   Airway Management Planned: Natural Airway and Nasal Cannula  Additional Equipment:   Intra-op Plan:   Post-operative Plan:   Informed Consent: I have reviewed the patients History and Physical, chart, labs and discussed the procedure including the risks, benefits and alternatives for the proposed anesthesia with the patient or authorized representative who has indicated his/her understanding and acceptance.     Dental Advisory Given  Plan Discussed with: Anesthesiologist, CRNA and Surgeon  Anesthesia Plan Comments: (Patient consented for risks of anesthesia including but not limited to:  - adverse reactions to medications - damage to eyes, teeth, lips or other oral mucosa - nerve damage due to positioning  - sore throat or hoarseness - Damage to heart, brain, nerves, lungs, other parts of body or loss of life  Patient voiced understanding and assent.)         Anesthesia Quick Evaluation

## 2023-09-17 NOTE — Discharge Instructions (Signed)

## 2023-09-24 ENCOUNTER — Encounter: Payer: Self-pay | Admitting: Ophthalmology

## 2023-09-24 ENCOUNTER — Ambulatory Visit: Payer: Self-pay | Admitting: Anesthesiology

## 2023-09-24 ENCOUNTER — Ambulatory Visit
Admission: RE | Admit: 2023-09-24 | Discharge: 2023-09-24 | Disposition: A | Discharge status: Home / Self Care | Attending: Ophthalmology | Admitting: Ophthalmology

## 2023-09-24 ENCOUNTER — Other Ambulatory Visit: Payer: Self-pay

## 2023-09-24 ENCOUNTER — Encounter
Admission: RE | Disposition: A | Discharge status: Home / Self Care | Payer: Self-pay | Source: Home / Self Care | Attending: Ophthalmology

## 2023-09-24 DIAGNOSIS — H2512 Age-related nuclear cataract, left eye: Secondary | ICD-10-CM | POA: Diagnosis present

## 2023-09-24 DIAGNOSIS — I10 Essential (primary) hypertension: Secondary | ICD-10-CM | POA: Diagnosis not present

## 2023-09-24 DIAGNOSIS — E1136 Type 2 diabetes mellitus with diabetic cataract: Secondary | ICD-10-CM | POA: Insufficient documentation

## 2023-09-24 DIAGNOSIS — E785 Hyperlipidemia, unspecified: Secondary | ICD-10-CM | POA: Diagnosis not present

## 2023-09-24 DIAGNOSIS — K219 Gastro-esophageal reflux disease without esophagitis: Secondary | ICD-10-CM | POA: Insufficient documentation

## 2023-09-24 DIAGNOSIS — F17210 Nicotine dependence, cigarettes, uncomplicated: Secondary | ICD-10-CM | POA: Diagnosis not present

## 2023-09-24 DIAGNOSIS — E1169 Type 2 diabetes mellitus with other specified complication: Secondary | ICD-10-CM | POA: Diagnosis not present

## 2023-09-24 DIAGNOSIS — H25012 Cortical age-related cataract, left eye: Secondary | ICD-10-CM | POA: Diagnosis present

## 2023-09-24 HISTORY — DX: Anxiety disorder, unspecified: F41.9

## 2023-09-24 HISTORY — DX: Type 2 diabetes mellitus with other specified complication: E11.69

## 2023-09-24 HISTORY — PX: CATARACT EXTRACTION W/PHACO: SHX586

## 2023-09-24 HISTORY — DX: Unspecified osteoarthritis, unspecified site: M19.90

## 2023-09-24 SURGERY — PHACOEMULSIFICATION, CATARACT, WITH IOL INSERTION
Anesthesia: Monitor Anesthesia Care | Site: Eye | Laterality: Left

## 2023-09-24 MED ORDER — SIGHTPATH DOSE#1 NA HYALUR & NA CHOND-NA HYALUR IO KIT
PACK | INTRAOCULAR | Status: DC | PRN
  Administered 2023-09-24: 1 via OPHTHALMIC

## 2023-09-24 MED ORDER — MIDAZOLAM HCL 2 MG/2ML IJ SOLN
INTRAMUSCULAR | Status: DC | PRN
  Administered 2023-09-24 (×2): 2 mg via INTRAVENOUS

## 2023-09-24 MED ORDER — LACTATED RINGERS IV SOLN: INTRAVENOUS | Status: DC

## 2023-09-24 MED ORDER — TETRACAINE HCL 0.5 % OP SOLN
1.0000 [drp] | OPHTHALMIC | Status: DC | PRN
  Administered 2023-09-24 (×3): 1 [drp] via OPHTHALMIC

## 2023-09-24 MED ORDER — SIGHTPATH DOSE#1 BSS IO SOLN
INTRAOCULAR | Status: DC | PRN
  Administered 2023-09-24: 76 mL via OPHTHALMIC

## 2023-09-24 MED ORDER — MIDAZOLAM HCL 2 MG/2ML IJ SOLN
INTRAMUSCULAR | Status: AC
  Filled 2023-09-24: qty 2

## 2023-09-24 MED ORDER — LIDOCAINE HCL (PF) 2 % IJ SOLN
INTRAOCULAR | Status: DC | PRN
  Administered 2023-09-24: 4 mL via INTRAOCULAR

## 2023-09-24 MED ORDER — FENTANYL CITRATE (PF) 100 MCG/2ML IJ SOLN
INTRAMUSCULAR | Status: DC | PRN
  Administered 2023-09-24: 100 ug via INTRAVENOUS

## 2023-09-24 MED ORDER — FENTANYL CITRATE (PF) 100 MCG/2ML IJ SOLN
INTRAMUSCULAR | Status: AC
Start: 2023-09-24 — End: 2023-09-24
  Filled 2023-09-24: qty 2

## 2023-09-24 MED ORDER — SIGHTPATH DOSE#1 BSS IO SOLN
INTRAOCULAR | Status: DC | PRN
Start: 2023-09-24 — End: 2023-09-24
  Administered 2023-09-24: 15 mL via INTRAOCULAR

## 2023-09-24 MED ORDER — ARMC OPHTHALMIC DILATING DROPS
OPHTHALMIC | Status: AC
  Filled 2023-09-24: qty 0.5

## 2023-09-24 MED ORDER — MOXIFLOXACIN HCL 0.5 % OP SOLN
OPHTHALMIC | Status: DC | PRN
  Administered 2023-09-24: .2 mL via OPHTHALMIC

## 2023-09-24 MED ORDER — TETRACAINE HCL 0.5 % OP SOLN
OPHTHALMIC | Status: AC
  Filled 2023-09-24: qty 4

## 2023-09-24 MED ORDER — ARMC OPHTHALMIC DILATING DROPS
1.0000 | OPHTHALMIC | Status: DC | PRN
  Administered 2023-09-24 (×2): 1 via OPHTHALMIC

## 2023-09-24 MED ORDER — BRIMONIDINE TARTRATE-TIMOLOL 0.2-0.5 % OP SOLN
OPHTHALMIC | Status: DC | PRN
  Administered 2023-09-24: 1 [drp] via OPHTHALMIC

## 2023-09-24 SURGICAL SUPPLY — 11 items
DISSECTOR HYDRO NUCLEUS 50X22 (MISCELLANEOUS) ×1 IMPLANT
DRSG TEGADERM 2-3/8X2-3/4 SM (GAUZE/BANDAGES/DRESSINGS) ×1 IMPLANT
FEE CATARACT SUITE SIGHTPATH (MISCELLANEOUS) ×1 IMPLANT
GLOVE BIOGEL PI IND STRL 8 (GLOVE) ×1 IMPLANT
GLOVE SURG LX STRL 7.5 STRW (GLOVE) ×1 IMPLANT
GLOVE SURG SYN 6.5 PF PI BL (GLOVE) ×1 IMPLANT
LENS IOL ENVISTA TRC 200 13.5 IMPLANT
NDL FILTER BLUNT 18X1 1/2 (NEEDLE) ×1 IMPLANT
NEEDLE FILTER BLUNT 18X1 1/2 (NEEDLE) ×1 IMPLANT
SYR 3ML LL SCALE MARK (SYRINGE) ×1 IMPLANT
enVista Toric IOL 13.5 (Intraocular Lens) ×2 IMPLANT

## 2023-09-24 NOTE — Op Note (Signed)
 OPERATIVE NOTE  Jamie Sanchez [id number] 09/24/2023   PREOPERATIVE DIAGNOSIS: Nuclear sclerotic cataract left eye. H25.12   POSTOPERATIVE DIAGNOSIS: Nuclear sclerotic cataract left eye. H25.12   PROCEDURE:  Phacoemusification with Toric posterior chamber intraocular lens placement of the left eye  Ultrasound time: Procedure(s): PHACOEMULSIFICATION, CATARACT, WITH IOL INSERTION 5.67 00:43.1 (Left)  LENS:   Implant Name Type Inv. Item Serial No. Manufacturer Lot No. LRB No. Used Action  enVista Toric IOL 13.5 Intraocular Lens  [id number] SIGHTPATH [id number] Left 1 Implanted and Explanted  [employer]Vista Toric IOL 13.5 Intraocular Lens  [id number] SIGHT[employer] [id number] Left 1 Implanted    MX60ET Toric intraocular lens with 2.00 diopters of cylindrical power with axis orientation at 100 degrees.  SURGEON:  Feliciano CHRISTELLA. Enola, MD   ANESTHESIA:  Topical with tetracaine  drops, augmented with 1% preservative-free intracameral lidocaine .   COMPLICATIONS:  None.   DESCRIPTION OF PROCEDURE:  The patient was identified in the holding room and transported to the operating room and placed in the supine position under the operating microscope.  The left eye was identified as the operative eye, which was prepped and draped in the usual sterile ophthalmic fashion.   A 1 millimeter clear-corneal paracentesis was made inferotemporally. Preservative-free 1% lidocaine  mixed with 1:1,000 bisulfite-free aqueous solution of epinephrine  was injected into the anterior chamber. The anterior chamber was then filled with Viscoat viscoelastic. A 2.4 millimeter keratome was used to make a clear-corneal incision superotemporally. A curvilinear capsulorrhexis was made with a cystotome and capsulorrhexis forceps. Balanced salt solution was used to hydrodissect and hydrodelineate the nucleus. Phacoemulsification was then used to remove the lens nucleus and epinucleus. The remaining cortex was then removed using the irrigation and  aspiration handpiece. Provisc was then placed into the capsular bag to distend it for lens placement. The Verion digital marker was used to align the implant at the intended axis.  A +13.50 D MX60ET Toric lens was then injected into the capsular bag. The trailing optic-haptic junction was broken so lens cutting scissors and lens grasping forceps were used to bisect the lens into two halves. Each half was then removed from the main incision. The anterior chamber was refilled with Provisc. A new +13.50 D MX60ET toric lens was then injected into the capsular bag. It was rotated clockwise until the axis marks on the lens were approximately 15 degrees in the counterclockwise direction to the intended alignment.  The viscoelastic was aspirated from the eye using the irrigation aspiration handpiece.  Then, a blunt chopper through the sideport incision was used to rotate the lens in a clockwise direction until the axis markings of the intraocular lens were lined up with the Verion alignment.  Balanced salt solution was then used to hydrate the wounds.   The anterior chamber was inflated to a physiologic pressure with balanced salt solution.  No wound leaks were noted. Moxifloxacin  was injected intracamerally  Timolol  and Brimonidine  drops were applied to the eye.  The patient was taken to the recovery room in stable condition without complications of anesthesia or surgery.  [employer] 09/24/2023, 10:16 AM

## 2023-09-24 NOTE — Transfer of Care (Signed)
 Immediate Anesthesia Transfer of Care Note  Patient: Jamie Sanchez  Procedure(s) Performed: PHACOEMULSIFICATION, CATARACT, WITH IOL INSERTION 5.67 00:43.1 (Left: Eye)  Patient Location: PACU  Anesthesia Type: MAC  Level of Consciousness: awake, alert  and patient cooperative  Airway and Oxygen Therapy: Patient Spontanous Breathing and Patient connected to supplemental oxygen  Post-op Assessment: Post-op Vital signs reviewed, Patient's Cardiovascular Status Stable, Respiratory Function Stable, Patent Airway and No signs of Nausea or vomiting  Post-op Vital Signs: Reviewed and stable  Complications: No notable events documented.

## 2023-09-24 NOTE — Anesthesia Postprocedure Evaluation (Signed)
 Anesthesia Post Note  Patient: Jamie Sanchez  Procedure(s) Performed: PHACOEMULSIFICATION, CATARACT, WITH IOL INSERTION 5.67 00:43.1 (Left: Eye)  Patient location during evaluation: PACU Anesthesia Type: MAC Level of consciousness: awake and alert Pain management: pain level controlled Vital Signs Assessment: post-procedure vital signs reviewed and stable Respiratory status: spontaneous breathing, nonlabored ventilation, respiratory function stable and patient connected to nasal cannula oxygen Cardiovascular status: stable and blood pressure returned to baseline Postop Assessment: no apparent nausea or vomiting Anesthetic complications: no   No notable events documented.   Last Vitals:  Vitals:   09/24/23 1017 09/24/23 1022  BP: (!) 116/96 130/87  Pulse: 66 63  Resp: (!) 9 15  Temp: (!) 36.3 C   SpO2: 98% 99%    Last Pain:  Vitals:   09/24/23 1022  TempSrc:   PainSc: 2                  Jarvis Sawa C Anees Vanecek

## 2023-09-24 NOTE — H&P (Signed)
 [facility]   Primary Care Physician:  Liana Fish, NP Ophthalmologist: Dr. Feliciano Ober  Pre-Procedure History & Physical: HPI:  Jamie Sanchez is a 53 y.o. female here for cataract surgery.   Past Medical History:  Diagnosis Date   Acute bronchitis 08/18/2018   Acute pain of right hip 06/24/2017   Acute upper respiratory infection 02/24/2020   Anxiety    Arthritis    Colitis 09/30/2017   GERD (gastroesophageal reflux disease)    Hyperlipidemia    Hyperlipidemia associated with type 2 diabetes mellitus (HCC)    Hypertension    Type 2 diabetes mellitus with other specified complication, without long-term current use of insulin (HCC)    Vaginal candida 08/18/2018    Past Surgical History:  Procedure Laterality Date   ABDOMINAL HYSTERECTOMY     APPENDECTOMY     INCONTINENCE SURGERY     KNEE ARTHROSCOPY Left     Prior to Admission medications   Medication Sig Start Date End Date Taking? Authorizing Provider  Accu-Chek FastClix Lancets MISC Use 1 lancet 3 times daily as needed to check glucose for diabetes E11.65 03/19/23   Liana Fish, NP  ALPRAZolam  (XANAX ) 0.25 MG tablet Take 1 tablet (0.25 mg total) by mouth 2 (two) times daily as needed for anxiety. 08/13/23   Liana Fish, NP  amLODipine  (NORVASC ) 5 MG tablet TAKE 1 TABLET(5 MG) BY MOUTH DAILY 06/10/23   Liana Fish, NP  atenolol  (TENORMIN ) 25 MG tablet TAKE 1 TABLET(25 MG) BY MOUTH DAILY 06/10/23   Liana Fish, NP  atorvastatin  (LIPITOR) 10 MG tablet Take 1 tablet (10 mg total) by mouth daily. 06/10/23   Liana Fish, NP  clotrimazole -betamethasone  (LOTRISONE ) cream Apply 1 Application topically daily. For rash until resolved 03/19/23   Liana Fish, NP  cyclobenzaprine  (FLEXERIL ) 10 MG tablet Take 1 tablet (10 mg total) by mouth at bedtime as needed for muscle spasms. 08/13/23   Liana Fish, NP  glucose blood (ACCU-CHEK GUIDE TEST) test strip Use 1 test strip 3 times daily as  needed to check glucose for diabetes E11.65 03/19/23   Liana Fish, NP  omeprazole  (PRILOSEC) 40 MG capsule TAKE 1 CAPSULE(40 MG) BY MOUTH DAILY 06/10/23   Abernathy, Fish, NP  Vitamin D , Ergocalciferol , (DRISDOL ) 1.25 MG (50000 UNIT) CAPS capsule Take 1 capsule (50,000 Units total) by mouth every 7 (seven) days. 03/19/23   Liana Fish, NP    Allergies as of 09/09/2023   (No Known Allergies)    Family History  Problem Relation Age of Onset   Heart attack Mother    Hypertension Father    Kidney cancer Father    Hypertension Sister    Heart attack Sister    Hypertension Brother    Heart attack Brother     Social History   Socioeconomic History   Marital status: Married    Spouse name: Not on file   Number of children: Not on file   Years of education: Not on file   Highest education level: Not on file  Occupational History   Not on file  Tobacco Use   Smoking status: Every Day    Current packs/day: 1.00    Average packs/day: 1 pack/day for 40.0 years (40.0 ttl pk-yrs)    Types: Cigarettes    Start date: 09/15/1983   Smokeless tobacco: Never  Vaping Use   Vaping status: Never Used  Substance and Sexual Activity   Alcohol use: Yes    Comment: occ   Drug use: Never  Sexual activity: Not on file  Other Topics Concern   Not on file  Social History Narrative   Not on file   Social Drivers of Health   Financial Resource Strain: Not on file  Food Insecurity: Not on file  Transportation Needs: Not on file  Physical Activity: Not on file  Stress: Not on file  Social Connections: Not on file  Intimate Partner Violence: Not on file    Review of Systems: See HPI, otherwise negative ROS  Physical Exam: Ht 5' 5 (1.651 m)   Wt 80.7 kg   BMI 29.62 kg/m  General:   Alert, cooperative in NAD Head:  Normocephalic and atraumatic. Respiratory:  Normal work of breathing. Cardiovascular:  RRR  Impression/Plan: Jamie Sanchez is here for cataract  surgery.  Risks, benefits, limitations, and alternatives regarding cataract surgery have been reviewed with the patient.  Questions have been answered.  All parties agreeable.   Feliciano Bryan Ober, MD  09/24/2023, 7:04 AM

## 2023-09-25 ENCOUNTER — Encounter: Payer: Self-pay | Admitting: Ophthalmology

## 2023-09-28 ENCOUNTER — Encounter: Payer: Self-pay | Admitting: Ophthalmology

## 2023-09-28 NOTE — Anesthesia Preprocedure Evaluation (Addendum)
 Anesthesia Evaluation  Patient identified by MRN, date of birth, ID band Patient awake    Reviewed: Allergy & Precautions, H&P , NPO status , Patient's Chart, lab work & pertinent test results  Airway Mallampati: III  TM Distance: >3 FB Neck ROM: Full    Dental no notable dental hx. (+) Upper Dentures   Pulmonary neg pulmonary ROS, Current Smoker and Patient abstained from smoking.  Auscultates clear and slightly diminished if breathing normally, but definite smokers' cough and rhonchi when laughing or coughing   Pulmonary exam normal breath sounds clear to auscultation       Cardiovascular hypertension, negative cardio ROS Normal cardiovascular exam Rhythm:Regular Rate:Normal   07-28-23 echo 1. Left ventricular ejection fraction, by estimation, is 60 to 65%. The  left ventricle has normal function. The left ventricle has no regional  wall motion abnormalities. Left ventricular diastolic parameters were  normal.   2. Right ventricular systolic function is normal. The right ventricular  size is normal.   3. The mitral valve is normal in structure. No evidence of mitral valve  regurgitation. No evidence of mitral stenosis.   4. The aortic valve is normal in structure. Aortic valve regurgitation is  not visualized. No aortic stenosis is present.   5. The inferior vena cava is normal in size with greater than 50%  respiratory variability, suggesting right atrial pressure of 3 mmHg.     Neuro/Psych  PSYCHIATRIC DISORDERS Anxiety     negative neurological ROS  negative psych ROS   GI/Hepatic negative GI ROS, Neg liver ROS,GERD  ,,  Endo/Other  negative endocrine ROSdiabetes    Renal/GU negative Renal ROS  negative genitourinary   Musculoskeletal negative musculoskeletal ROS (+) Arthritis ,    Abdominal   Peds negative pediatric ROS (+)  Hematology negative hematology ROS (+)   Anesthesia Other Findings Previous  cataract surgery 09-24-23 Dr. Ola   Hyperlipidemia             GERD (gastroesophageal reflux disease) Acute pain of right hip           Colitis Acute upper respiratory infectionAcute bronchitis Vaginal candida           Hypertension Arthritis             Diabetes mellitus without complication  Anxiety, takes alprazolam  prn   Reproductive/Obstetrics negative OB ROS                              Anesthesia Physical Anesthesia Plan  ASA: 3  Anesthesia Plan: MAC   Post-op Pain Management:    Induction: Intravenous  PONV Risk Score and Plan:   Airway Management Planned: Natural Airway and Nasal Cannula  Additional Equipment:   Intra-op Plan:   Post-operative Plan:   Informed Consent: I have reviewed the patients History and Physical, chart, labs and discussed the procedure including the risks, benefits and alternatives for the proposed anesthesia with the patient or authorized representative who has indicated his/her understanding and acceptance.     Dental Advisory Given  Plan Discussed with: Anesthesiologist, CRNA and Surgeon  Anesthesia Plan Comments: (Patient consented for risks of anesthesia including but not limited to:  - adverse reactions to medications - damage to eyes, teeth, lips or other oral mucosa - nerve damage due to positioning  - sore throat or hoarseness - Damage to heart, brain, nerves, lungs, other parts of body or loss of life  Patient voiced understanding  and assent.)         Anesthesia Quick Evaluation

## 2023-10-06 ENCOUNTER — Other Ambulatory Visit: Payer: Self-pay | Admitting: Nurse Practitioner

## 2023-10-06 DIAGNOSIS — R109 Unspecified abdominal pain: Secondary | ICD-10-CM

## 2023-10-06 NOTE — Discharge Instructions (Signed)

## 2023-10-08 ENCOUNTER — Other Ambulatory Visit: Payer: Self-pay

## 2023-10-08 ENCOUNTER — Encounter: Payer: Self-pay | Admitting: Ophthalmology

## 2023-10-08 ENCOUNTER — Encounter
Admission: RE | Disposition: A | Discharge status: Home / Self Care | Payer: Self-pay | Source: Home / Self Care | Attending: Ophthalmology

## 2023-10-08 ENCOUNTER — Ambulatory Visit: Payer: Self-pay | Admitting: Anesthesiology

## 2023-10-08 ENCOUNTER — Ambulatory Visit
Admission: RE | Admit: 2023-10-08 | Discharge: 2023-10-08 | Disposition: A | Discharge status: Home / Self Care | Attending: Ophthalmology | Admitting: Ophthalmology

## 2023-10-08 DIAGNOSIS — I10 Essential (primary) hypertension: Secondary | ICD-10-CM | POA: Diagnosis not present

## 2023-10-08 DIAGNOSIS — M1990 Unspecified osteoarthritis, unspecified site: Secondary | ICD-10-CM | POA: Insufficient documentation

## 2023-10-08 DIAGNOSIS — Z8249 Family history of ischemic heart disease and other diseases of the circulatory system: Secondary | ICD-10-CM | POA: Insufficient documentation

## 2023-10-08 DIAGNOSIS — F17210 Nicotine dependence, cigarettes, uncomplicated: Secondary | ICD-10-CM | POA: Insufficient documentation

## 2023-10-08 DIAGNOSIS — E1136 Type 2 diabetes mellitus with diabetic cataract: Secondary | ICD-10-CM | POA: Diagnosis not present

## 2023-10-08 DIAGNOSIS — F419 Anxiety disorder, unspecified: Secondary | ICD-10-CM | POA: Diagnosis not present

## 2023-10-08 DIAGNOSIS — H2511 Age-related nuclear cataract, right eye: Secondary | ICD-10-CM | POA: Diagnosis present

## 2023-10-08 DIAGNOSIS — K219 Gastro-esophageal reflux disease without esophagitis: Secondary | ICD-10-CM | POA: Diagnosis not present

## 2023-10-08 HISTORY — PX: CATARACT EXTRACTION W/PHACO: SHX586

## 2023-10-08 SURGERY — PHACOEMULSIFICATION, CATARACT, WITH IOL INSERTION
Anesthesia: Monitor Anesthesia Care | Laterality: Right

## 2023-10-08 MED ORDER — TETRACAINE HCL 0.5 % OP SOLN
OPHTHALMIC | Status: AC
  Filled 2023-10-08: qty 4

## 2023-10-08 MED ORDER — MOXIFLOXACIN HCL 0.5 % OP SOLN
OPHTHALMIC | Status: DC | PRN
  Administered 2023-10-08: .2 mL via OPHTHALMIC

## 2023-10-08 MED ORDER — LACTATED RINGERS IV SOLN: INTRAVENOUS | Status: DC

## 2023-10-08 MED ORDER — FENTANYL CITRATE (PF) 100 MCG/2ML IJ SOLN
INTRAMUSCULAR | Status: AC
Start: 2023-10-08 — End: 2023-10-08
  Filled 2023-10-08: qty 2

## 2023-10-08 MED ORDER — MIDAZOLAM HCL 2 MG/2ML IJ SOLN
INTRAMUSCULAR | Status: AC
Start: 2023-10-08 — End: 2023-10-08
  Filled 2023-10-08: qty 2

## 2023-10-08 MED ORDER — SIGHTPATH DOSE#1 BSS IO SOLN
INTRAOCULAR | Status: DC | PRN
  Administered 2023-10-08: 88 mL via OPHTHALMIC

## 2023-10-08 MED ORDER — ARMC OPHTHALMIC DILATING DROPS
1.0000 | OPHTHALMIC | Status: DC | PRN
  Administered 2023-10-08 (×3): 1 via OPHTHALMIC

## 2023-10-08 MED ORDER — SIGHTPATH DOSE#1 NA HYALUR & NA CHOND-NA HYALUR IO KIT
PACK | INTRAOCULAR | Status: DC | PRN
  Administered 2023-10-08: 1 via OPHTHALMIC

## 2023-10-08 MED ORDER — FENTANYL CITRATE (PF) 100 MCG/2ML IJ SOLN
INTRAMUSCULAR | Status: DC | PRN
  Administered 2023-10-08: 100 ug via INTRAVENOUS

## 2023-10-08 MED ORDER — ARMC OPHTHALMIC DILATING DROPS
OPHTHALMIC | Status: AC
  Filled 2023-10-08: qty 0.5

## 2023-10-08 MED ORDER — SIGHTPATH DOSE#1 BSS IO SOLN
INTRAOCULAR | Status: DC | PRN
  Administered 2023-10-08: 15 mL via INTRAOCULAR

## 2023-10-08 MED ORDER — MIDAZOLAM HCL 2 MG/2ML IJ SOLN
INTRAMUSCULAR | Status: AC
  Filled 2023-10-08: qty 2

## 2023-10-08 MED ORDER — MIDAZOLAM HCL 2 MG/2ML IJ SOLN
INTRAMUSCULAR | Status: DC | PRN
  Administered 2023-10-08: 1 mg via INTRAVENOUS
  Administered 2023-10-08: 2 mg via INTRAVENOUS

## 2023-10-08 MED ORDER — LIDOCAINE HCL (PF) 2 % IJ SOLN
INTRAOCULAR | Status: DC | PRN
  Administered 2023-10-08: 1 mL via INTRAOCULAR

## 2023-10-08 MED ORDER — TETRACAINE HCL 0.5 % OP SOLN
1.0000 [drp] | OPHTHALMIC | Status: DC | PRN
  Administered 2023-10-08 (×3): 1 [drp] via OPHTHALMIC

## 2023-10-08 MED ORDER — BRIMONIDINE TARTRATE-TIMOLOL 0.2-0.5 % OP SOLN
OPHTHALMIC | Status: DC | PRN
  Administered 2023-10-08: 1 [drp] via OPHTHALMIC

## 2023-10-08 SURGICAL SUPPLY — 10 items
DISSECTOR HYDRO NUCLEUS 50X22 (MISCELLANEOUS) ×1 IMPLANT
DRSG TEGADERM 2-3/8X2-3/4 SM (GAUZE/BANDAGES/DRESSINGS) ×1 IMPLANT
FEE CATARACT SUITE SIGHTPATH (MISCELLANEOUS) ×1 IMPLANT
GLOVE BIOGEL PI IND STRL 8 (GLOVE) ×1 IMPLANT
GLOVE SURG LX STRL 7.5 STRW (GLOVE) ×1 IMPLANT
GLOVE SURG SYN 6.5 PF PI BL (GLOVE) ×1 IMPLANT
LENS IOL ENVISTA UV+ 15.0 (Intraocular Lens) IMPLANT
NDL FILTER BLUNT 18X1 1/2 (NEEDLE) ×1 IMPLANT
NEEDLE FILTER BLUNT 18X1 1/2 (NEEDLE) ×1 IMPLANT
SYR 3ML LL SCALE MARK (SYRINGE) ×1 IMPLANT

## 2023-10-08 NOTE — Anesthesia Postprocedure Evaluation (Signed)
 Anesthesia Post Note  Patient: Jamie Sanchez  Procedure(s) Performed: PHACOEMULSIFICATION, CATARACT, WITH IOL INSERTION 5.18, 00:40.5 (Right)  Patient location during evaluation: PACU Anesthesia Type: MAC Level of consciousness: awake and alert Pain management: pain level controlled Vital Signs Assessment: post-procedure vital signs reviewed and stable Respiratory status: spontaneous breathing, nonlabored ventilation, respiratory function stable and patient connected to nasal cannula oxygen Cardiovascular status: stable and blood pressure returned to baseline Postop Assessment: no apparent nausea or vomiting Anesthetic complications: no   No notable events documented.   Last Vitals:  Vitals:   10/08/23 0755 10/08/23 0757  BP: 126/76 110/82  Pulse: 69 67  Resp: 15 14  Temp:  (!) 36.2 C  SpO2: 95% 95%    Last Pain:  Vitals:   10/08/23 0757  TempSrc:   PainSc: 0-No pain                 Donny JAYSON Mu

## 2023-10-08 NOTE — H&P (Signed)
 [facility]   Primary Care Physician:  Liana Fish, NP Ophthalmologist: Dr. Feliciano Ober  Pre-Procedure History & Physical: HPI:  Jamie Sanchez is a 53 y.o. female here for cataract surgery.   Past Medical History:  Diagnosis Date   Acute bronchitis 08/18/2018   Acute pain of right hip 06/24/2017   Acute upper respiratory infection 02/24/2020   Anxiety    Arthritis    Colitis 09/30/2017   GERD (gastroesophageal reflux disease)    Hyperlipidemia    Hyperlipidemia associated with type 2 diabetes mellitus (HCC)    Hypertension    Type 2 diabetes mellitus with other specified complication, without long-term current use of insulin (HCC)    Vaginal candida 08/18/2018    Past Surgical History:  Procedure Laterality Date   ABDOMINAL HYSTERECTOMY     APPENDECTOMY     CATARACT EXTRACTION W/PHACO Left 09/24/2023   Procedure: PHACOEMULSIFICATION, CATARACT, WITH IOL INSERTION 5.67 00:43.1;  Surgeon: Ober Feliciano Hugger, MD;  Location: [facility];  Service: Ophthalmology;  Laterality: Left;   INCONTINENCE SURGERY     KNEE ARTHROSCOPY Left     Prior to Admission medications   Medication Sig Start Date End Date Taking? Authorizing Provider  ALPRAZolam  (XANAX ) 0.25 MG tablet Take 1 tablet (0.25 mg total) by mouth 2 (two) times daily as needed for anxiety. 08/13/23  Yes Abernathy, Fish, NP  amLODipine  (NORVASC ) 5 MG tablet TAKE 1 TABLET(5 MG) BY MOUTH DAILY 06/10/23  Yes Abernathy, Alyssa, NP  atenolol  (TENORMIN ) 25 MG tablet TAKE 1 TABLET(25 MG) BY MOUTH DAILY 06/10/23  Yes Abernathy, Alyssa, NP  atorvastatin  (LIPITOR) 10 MG tablet Take 1 tablet (10 mg total) by mouth daily. 06/10/23  Yes Abernathy, Fish, NP  clotrimazole -betamethasone  (LOTRISONE ) cream Apply 1 Application topically daily. For rash until resolved 03/19/23  Yes Abernathy, Alyssa, NP  cyclobenzaprine  (FLEXERIL ) 10 MG tablet TAKE ONE TABLET (10 MG TOTAL) BY MOUTH AT BEDTIME AS NEEDED FOR MUSCLE SPASMS.  10/07/23  Yes Abernathy, Fish, NP  omeprazole  (PRILOSEC) 40 MG capsule TAKE 1 CAPSULE(40 MG) BY MOUTH DAILY 06/10/23  Yes Abernathy, Alyssa, NP  Vitamin D , Ergocalciferol , (DRISDOL ) 1.25 MG (50000 UNIT) CAPS capsule Take 1 capsule (50,000 Units total) by mouth every 7 (seven) days. 03/19/23  Yes Liana Fish, NP  Accu-Chek FastClix Lancets MISC Use 1 lancet 3 times daily as needed to check glucose for diabetes E11.65 03/19/23   Liana Fish, NP  glucose blood (ACCU-CHEK GUIDE TEST) test strip Use 1 test strip 3 times daily as needed to check glucose for diabetes E11.65 03/19/23   Liana Fish, NP    Allergies as of 09/09/2023   (No Known Allergies)    Family History  Problem Relation Age of Onset   Heart attack Mother    Hypertension Father    Kidney cancer Father    Hypertension Sister    Heart attack Sister    Hypertension Brother    Heart attack Brother     Social History   Socioeconomic History   Marital status: Married    Spouse name: Not on file   Number of children: Not on file   Years of education: Not on file   Highest education level: Not on file  Occupational History   Not on file  Tobacco Use   Smoking status: Every Day    Current packs/day: 1.00    Average packs/day: 1 pack/day for 40.1 years (40.1 ttl pk-yrs)    Types: Cigarettes    Start date: 09/15/1983  Smokeless tobacco: Never  Vaping Use   Vaping status: Never Used  Substance and Sexual Activity   Alcohol use: Yes    Comment: occ   Drug use: Never   Sexual activity: Not on file  Other Topics Concern   Not on file  Social History Narrative   Not on file   Social Drivers of Health   Financial Resource Strain: Not on file  Food Insecurity: Not on file  Transportation Needs: Not on file  Physical Activity: Not on file  Stress: Not on file  Social Connections: Not on file  Intimate Partner Violence: Not on file    Review of Systems: See HPI, otherwise negative ROS  Physical  Exam: BP (!) 136/92   Temp (!) 97.3 F (36.3 C) (Temporal)   Resp 17   Ht 5' 5 (1.651 m)   Wt 82.6 kg   SpO2 95%   BMI 30.29 kg/m  General:   Alert, cooperative in NAD Head:  Normocephalic and atraumatic. Respiratory:  Normal work of breathing. Cardiovascular:  RRR  Impression/Plan: Jamie Sanchez is here for cataract surgery.  Risks, benefits, limitations, and alternatives regarding cataract surgery have been reviewed with the patient.  Questions have been answered.  All parties agreeable.   Feliciano Bryan Ober, MD  10/08/2023, 7:08 AM

## 2023-10-08 NOTE — Transfer of Care (Signed)
 Immediate Anesthesia Transfer of Care Note  Patient: Jamie Sanchez  Procedure(s) Performed: PHACOEMULSIFICATION, CATARACT, WITH IOL INSERTION 5.18, 00:40.5 (Right)  Patient Location: PACU  Anesthesia Type: MAC  Level of Consciousness: awake, alert  and patient cooperative  Airway and Oxygen Therapy: Patient Spontanous Breathing and Patient connected to supplemental oxygen  Post-op Assessment: Post-op Vital signs reviewed, Patient's Cardiovascular Status Stable, Respiratory Function Stable, Patent Airway and No signs of Nausea or vomiting  Post-op Vital Signs: Reviewed and stable  Complications: No notable events documented.

## 2023-10-08 NOTE — Op Note (Signed)
 OPERATIVE NOTE  Jamie Sanchez [id number] 10/08/2023   PREOPERATIVE DIAGNOSIS: Nuclear sclerotic cataract right eye. H25.11   POSTOPERATIVE DIAGNOSIS: Nuclear sclerotic cataract right eye. H25.11   PROCEDURE:  Phacoemulsification with posterior chamber intraocular lens placement of the right eye  Ultrasound time: Procedure(s): PHACOEMULSIFICATION, CATARACT, WITH IOL INSERTION 5.18, 00:40.5 (Right)  LENS:   Implant Name Type Inv. Item Serial No. Manufacturer Lot No. LRB No. Used Action  LENS IOL ENVISTA UV+ 15.0 - [id number] Intraocular Lens LENS IOL ENVISTA UV+ 15.0 [id number] [employer]  Right 1 Implanted      SURGEON:  Feliciano CHRISTELLA. Enola, MD   ANESTHESIA:  Topical with tetracaine  drops, augmented with 1% preservative-free intracameral lidocaine .   COMPLICATIONS:  None.   DESCRIPTION OF PROCEDURE:  The patient was identified in the holding room and transported to the operating room and placed in the supine position under the operating microscope.  The right eye was identified as the operative eye, which was prepped and draped in the usual sterile ophthalmic fashion.   A 1 millimeter clear-corneal paracentesis was made superotemporally. Preservative-free 1% lidocaine  mixed with 1:1,000 bisulfite-free aqueous solution of epinephrine  was injected into the anterior chamber. The anterior chamber was then filled with Viscoat viscoelastic. A 2.4 millimeter keratome was used to make a clear-corneal incision inferotemporally. A curvilinear capsulorrhexis was made with a cystotome and capsulorrhexis forceps. Balanced salt solution was used to hydrodissect and hydrodelineate the nucleus. Phacoemulsification was then used to remove the lens nucleus and epinucleus. The remaining cortex was then removed using the irrigation and aspiration handpiece. Provisc was then placed into the capsular bag to distend it for lens placement. A +15.00 D EE intraocular lens was then injected into the capsular bag. The  remaining viscoelastic was aspirated.   Wounds were hydrated with balanced salt solution.  The anterior chamber was inflated to a physiologic pressure with balanced salt solution.  No wound leaks were noted. Moxifloxacin  was injected intracamerally.  Timolol  and Brimonidine  drops were applied to the eye.  The patient was taken to the recovery room in stable condition without complications of anesthesia or surgery.  Feliciano Hugger [place] 10/08/2023, 7:51 AM

## 2023-11-05 ENCOUNTER — Encounter: Payer: Self-pay | Admitting: Nurse Practitioner

## 2023-11-05 ENCOUNTER — Ambulatory Visit: Admitting: Nurse Practitioner

## 2023-11-05 VITALS — BP 138/88 | HR 75 | Temp 98.2°F | Resp 16 | Ht 65.0 in | Wt 184.2 lb

## 2023-11-05 DIAGNOSIS — E559 Vitamin D deficiency, unspecified: Secondary | ICD-10-CM

## 2023-11-05 DIAGNOSIS — E1169 Type 2 diabetes mellitus with other specified complication: Secondary | ICD-10-CM

## 2023-11-05 DIAGNOSIS — K219 Gastro-esophageal reflux disease without esophagitis: Secondary | ICD-10-CM | POA: Diagnosis not present

## 2023-11-05 DIAGNOSIS — E785 Hyperlipidemia, unspecified: Secondary | ICD-10-CM

## 2023-11-05 DIAGNOSIS — M1990 Unspecified osteoarthritis, unspecified site: Secondary | ICD-10-CM | POA: Diagnosis not present

## 2023-11-05 DIAGNOSIS — F411 Generalized anxiety disorder: Secondary | ICD-10-CM

## 2023-11-05 DIAGNOSIS — I152 Hypertension secondary to endocrine disorders: Secondary | ICD-10-CM

## 2023-11-05 MED ORDER — VITAMIN D (ERGOCALCIFEROL) 1.25 MG (50000 UNIT) PO CAPS: 50000.0000 [IU] | ORAL_CAPSULE | ORAL | 5 refills | Status: AC

## 2023-11-05 MED ORDER — CYCLOBENZAPRINE HCL 10 MG PO TABS: ORAL_TABLET | ORAL | 1 refills | Status: DC

## 2023-11-05 MED ORDER — ATORVASTATIN CALCIUM 10 MG PO TABS: 10.0000 mg | ORAL_TABLET | Freq: Every day | ORAL | 1 refills | Status: DC

## 2023-11-05 MED ORDER — OMEPRAZOLE 40 MG PO CPDR
DELAYED_RELEASE_CAPSULE | ORAL | 1 refills | Status: AC
Start: 2023-11-05 — End: ?

## 2023-11-05 MED ORDER — ATENOLOL 25 MG PO TABS: ORAL_TABLET | ORAL | 1 refills | Status: AC

## 2023-11-05 MED ORDER — AMLODIPINE BESYLATE 5 MG PO TABS: ORAL_TABLET | ORAL | 1 refills | Status: AC

## 2023-11-05 MED ORDER — ALPRAZOLAM 0.25 MG PO TABS: 0.2500 mg | ORAL_TABLET | Freq: Two times a day (BID) | ORAL | 2 refills | Status: DC | PRN

## 2023-11-05 NOTE — Progress Notes (Signed)
 [facility] [address] [place], [place] [address]  Internal MEDICINE  Office Visit Note  Patient Name: Jamie Sanchez  [id number]  [id number]  Date of Service: 11/05/2023  Chief Complaint  Patient presents with   Diabetes   Gastroesophageal Reflux   Hypertension   Hyperlipidemia    HPI Brinsley presents for a follow-up visit for hypertension, high cholesterol and anxiety, and refills.  Hypertension -- controlled with atenolol  and amlodipine .  High cholesterol -- taking atorvastatin  daily Anxiety -- takes alprazolam  as needed     Current Medication: Outpatient Encounter Medications as of 11/05/2023  Medication Sig   Accu-Chek FastClix Lancets MISC Use 1 lancet 3 times daily as needed to check glucose for diabetes E11.65   ALPRAZolam  (XANAX ) 0.25 MG tablet Take 1 tablet (0.25 mg total) by mouth 2 (two) times daily as needed for anxiety.   amLODipine  (NORVASC ) 5 MG tablet TAKE 1 TABLET(5 MG) BY MOUTH DAILY   atenolol  (TENORMIN ) 25 MG tablet TAKE 1 TABLET(25 MG) BY MOUTH DAILY   atorvastatin  (LIPITOR) 10 MG tablet Take 1 tablet (10 mg total) by mouth daily.   clotrimazole -betamethasone  (LOTRISONE ) cream Apply 1 Application topically daily. For rash until resolved   cyclobenzaprine  (FLEXERIL ) 10 MG tablet TAKE ONE TABLET (10 MG TOTAL) BY MOUTH AT BEDTIME AS NEEDED FOR MUSCLE SPASMS.   glucose blood (ACCU-CHEK GUIDE TEST) test strip Use 1 test strip 3 times daily as needed to check glucose for diabetes E11.65   omeprazole  (PRILOSEC) 40 MG capsule TAKE 1 CAPSULE(40 MG) BY MOUTH DAILY   Vitamin D , Ergocalciferol , (DRISDOL ) 1.25 MG (50000 UNIT) CAPS capsule Take 1 capsule (50,000 Units total) by mouth every 7 (seven) days.   [DISCONTINUED] ALPRAZolam  (XANAX ) 0.25 MG tablet Take 1 tablet (0.25 mg total) by mouth 2 (two) times daily as needed for anxiety.   [DISCONTINUED] amLODipine  (NORVASC ) 5 MG tablet TAKE 1 TABLET(5 MG) BY MOUTH DAILY   [DISCONTINUED] atenolol  (TENORMIN )  25 MG tablet TAKE 1 TABLET(25 MG) BY MOUTH DAILY   [DISCONTINUED] atorvastatin  (LIPITOR) 10 MG tablet Take 1 tablet (10 mg total) by mouth daily.   [DISCONTINUED] cyclobenzaprine  (FLEXERIL ) 10 MG tablet TAKE ONE TABLET (10 MG TOTAL) BY MOUTH AT BEDTIME AS NEEDED FOR MUSCLE SPASMS.   [DISCONTINUED] omeprazole  (PRILOSEC) 40 MG capsule TAKE 1 CAPSULE(40 MG) BY MOUTH DAILY   [DISCONTINUED] Vitamin D , Ergocalciferol , (DRISDOL ) 1.25 MG (50000 UNIT) CAPS capsule Take 1 capsule (50,000 Units total) by mouth every 7 (seven) days.   No facility-administered encounter medications on file as of 11/05/2023.    Surgical History: Past Surgical History:  Procedure Laterality Date   ABDOMINAL HYSTERECTOMY     APPENDECTOMY     CATARACT EXTRACTION W/PHACO Left 09/24/2023   Procedure: PHACOEMULSIFICATION, CATARACT, WITH IOL INSERTION 5.67 00:43.1;  Surgeon: Enola Feliciano Hugger, MD;  Location: [facility];  Service: Ophthalmology;  Laterality: Left;   CATARACT EXTRACTION W/PHACO Right 10/08/2023   Procedure: PHACOEMULSIFICATION, CATARACT, WITH IOL INSERTION 5.18, 00:40.5;  Surgeon: Enola Feliciano Hugger, MD;  Location: [facility];  Service: Ophthalmology;  Laterality: Right;   INCONTINENCE SURGERY     KNEE ARTHROSCOPY Left     Medical History: Past Medical History:  Diagnosis Date   Acute bronchitis 08/18/2018   Acute pain of right hip 06/24/2017   Acute upper respiratory infection 02/24/2020   Anxiety    Arthritis    Colitis 09/30/2017   GERD (gastroesophageal reflux disease)    Hyperlipidemia    Hyperlipidemia associated with type  2 diabetes mellitus (HCC)    Hypertension    Type 2 diabetes mellitus with other specified complication, without long-term current use of insulin (HCC)    Vaginal candida 08/18/2018    Family History: Family History  Problem Relation Age of Onset   Heart attack Mother    Hypertension Father    Kidney cancer Father    Hypertension Sister    Heart  attack Sister    Hypertension Brother    Heart attack Brother     Social History   Socioeconomic History   Marital status: Married    Spouse name: Not on file   Number of children: Not on file   Years of education: Not on file   Highest education level: Not on file  Occupational History   Not on file  Tobacco Use   Smoking status: Every Day    Current packs/day: 1.00    Average packs/day: 1 pack/day for 40.1 years (40.1 ttl pk-yrs)    Types: Cigarettes    Start date: 09/15/1983   Smokeless tobacco: Never  Vaping Use   Vaping status: Never Used  Substance and Sexual Activity   Alcohol use: Yes    Comment: occ   Drug use: Never   Sexual activity: Not on file  Other Topics Concern   Not on file  Social History Narrative   Not on file   Social Drivers of Health   Financial Resource Strain: Not on file  Food Insecurity: Not on file  Transportation Needs: Not on file  Physical Activity: Not on file  Stress: Not on file  Social Connections: Not on file  Intimate Partner Violence: Not on file      Review of Systems  Constitutional:  Negative for chills, fatigue and unexpected weight change.  HENT:  Negative for congestion, rhinorrhea, sneezing and sore throat.   Eyes:  Negative for redness.  Respiratory: Negative.  Negative for cough, chest tightness, shortness of breath and wheezing.   Cardiovascular: Negative.  Negative for chest pain and palpitations.  Gastrointestinal:  Negative for abdominal pain, constipation, diarrhea, nausea and vomiting.  Genitourinary: Negative.   Musculoskeletal:  Negative for arthralgias, back pain, joint swelling and neck pain.  Skin:  Negative for rash.  Neurological: Negative.  Negative for tremors and numbness.  Hematological:  Negative for adenopathy. Does not bruise/bleed easily.  Psychiatric/Behavioral:  Negative for behavioral problems (Depression), sleep disturbance and suicidal ideas. The patient is not nervous/anxious.      Vital Signs: BP 138/88   Pulse 75   Temp 98.2 F (36.8 C)   Resp 16   Ht 5' 5 (1.651 m)   Wt 184 lb 3.2 oz (83.6 kg)   SpO2 97%   BMI 30.65 kg/m    Physical Exam Vitals reviewed.  Constitutional:      Appearance: Normal appearance. She is obese.  HENT:     Head: Normocephalic and atraumatic.  Cardiovascular:     Rate and Rhythm: Normal rate and regular rhythm.     Pulses: Normal pulses.     Heart sounds: Normal heart sounds. No murmur heard. Pulmonary:     Effort: Pulmonary effort is normal. No respiratory distress.     Breath sounds: Normal breath sounds.  Skin:    General: Skin is warm and dry.     Capillary Refill: Capillary refill takes less than 2 seconds.  Neurological:     Mental Status: She is alert and oriented to person, place, and time.  Psychiatric:  Mood and Affect: Mood normal.        Behavior: Behavior normal.        Assessment/Plan: 1. Hypertension associated with diabetes (HCC) (Primary) Stable, continue atenolol  and amlodipine  as prescribed.  - atenolol  (TENORMIN ) 25 MG tablet; TAKE 1 TABLET(25 MG) BY MOUTH DAILY  Dispense: 90 tablet; Refill: 1 - amLODipine  (NORVASC ) 5 MG tablet; TAKE 1 TABLET(5 MG) BY MOUTH DAILY  Dispense: 90 tablet; Refill: 1  2. Hyperlipidemia associated with type 2 diabetes mellitus (HCC) Continue atorvastatin  as prescribed.  - atorvastatin  (LIPITOR) 10 MG tablet; Take 1 tablet (10 mg total) by mouth daily.  Dispense: 90 tablet; Refill: 1  3. Gastroesophageal reflux disease without esophagitis Continue omeprazole  as prescribed.  - omeprazole  (PRILOSEC) 40 MG capsule; TAKE 1 CAPSULE(40 MG) BY MOUTH DAILY  Dispense: 90 capsule; Refill: 1  4. Arthritis Take cyclobenzaprine  as needed as prescribed.  - cyclobenzaprine  (FLEXERIL ) 10 MG tablet; TAKE ONE TABLET (10 MG TOTAL) BY MOUTH AT BEDTIME AS NEEDED FOR MUSCLE SPASMS.  Dispense: 30 tablet; Refill: 1  5. Vitamin D  deficiency Continue weekly supplement as  prescribed.  - Vitamin D , Ergocalciferol , (DRISDOL ) 1.25 MG (50000 UNIT) CAPS capsule; Take 1 capsule (50,000 Units total) by mouth every 7 (seven) days.  Dispense: 4 capsule; Refill: 5  6. Generalized anxiety disorder Continue prn alprazolam  as prescribed. Follow up in 3 months for additional refills.  - ALPRAZolam  (XANAX ) 0.25 MG tablet; Take 1 tablet (0.25 mg total) by mouth 2 (two) times daily as needed for anxiety.  Dispense: 60 tablet; Refill: 2   General Counseling: kamarie veno understanding of the findings of todays visit and agrees with plan of treatment. I have discussed any further diagnostic evaluation that may be needed or ordered today. We also reviewed her medications today. she has been encouraged to call the office with any questions or concerns that should arise related to todays visit.    No orders of the defined types were placed in this encounter.   Meds ordered this encounter  Medications   ALPRAZolam  (XANAX ) 0.25 MG tablet    Sig: Take 1 tablet (0.25 mg total) by mouth 2 (two) times daily as needed for anxiety.    Dispense:  60 tablet    Refill:  2    Refills ordered   atorvastatin  (LIPITOR) 10 MG tablet    Sig: Take 1 tablet (10 mg total) by mouth daily.    Dispense:  90 tablet    Refill:  1   atenolol  (TENORMIN ) 25 MG tablet    Sig: TAKE 1 TABLET(25 MG) BY MOUTH DAILY    Dispense:  90 tablet    Refill:  1   amLODipine  (NORVASC ) 5 MG tablet    Sig: TAKE 1 TABLET(5 MG) BY MOUTH DAILY    Dispense:  90 tablet    Refill:  1   omeprazole  (PRILOSEC) 40 MG capsule    Sig: TAKE 1 CAPSULE(40 MG) BY MOUTH DAILY    Dispense:  90 capsule    Refill:  1   cyclobenzaprine  (FLEXERIL ) 10 MG tablet    Sig: TAKE ONE TABLET (10 MG TOTAL) BY MOUTH AT BEDTIME AS NEEDED FOR MUSCLE SPASMS.    Dispense:  30 tablet    Refill:  1    For future refills   Vitamin D , Ergocalciferol , (DRISDOL ) 1.25 MG (50000 UNIT) CAPS capsule    Sig: Take 1 capsule (50,000 Units total) by  mouth every 7 (seven) days.    Dispense:  4  capsule    Refill:  5    Return in about 3 months (around 01/27/2024) for F/U, anxiety med refill, Javaria Knapke PCP.   Total time spent:30 Minutes Time spent includes review of chart, medications, test results, and follow up plan with the patient.   [place] Controlled Substance Database was reviewed by me.  This patient was seen by Mardy Maxin, FNP-C in collaboration with Dr. Sigrid Bathe as a part of collaborative care agreement.   Jalee Saine R. Maxin, MSN, FNP-C Internal medicine

## 2023-11-08 ENCOUNTER — Encounter: Payer: Self-pay | Admitting: Nurse Practitioner

## 2024-01-04 ENCOUNTER — Telehealth: Payer: Self-pay

## 2024-01-04 NOTE — Telephone Encounter (Signed)
 [facility] called for pt insurance card information [id number]  reference [id number] gave them insurance information

## 2024-01-27 ENCOUNTER — Ambulatory Visit (INDEPENDENT_AMBULATORY_CARE_PROVIDER_SITE_OTHER): Admitting: Nurse Practitioner

## 2024-01-27 ENCOUNTER — Encounter: Payer: Self-pay | Admitting: Nurse Practitioner

## 2024-01-27 VITALS — BP 132/88 | HR 83 | Temp 97.5°F | Resp 16 | Ht 65.0 in | Wt 193.8 lb

## 2024-01-27 DIAGNOSIS — E785 Hyperlipidemia, unspecified: Secondary | ICD-10-CM

## 2024-01-27 DIAGNOSIS — E559 Vitamin D deficiency, unspecified: Secondary | ICD-10-CM

## 2024-01-27 DIAGNOSIS — E1169 Type 2 diabetes mellitus with other specified complication: Secondary | ICD-10-CM | POA: Diagnosis not present

## 2024-01-27 DIAGNOSIS — E538 Deficiency of other specified B group vitamins: Secondary | ICD-10-CM

## 2024-01-27 DIAGNOSIS — F411 Generalized anxiety disorder: Secondary | ICD-10-CM | POA: Diagnosis not present

## 2024-01-27 MED ORDER — ALPRAZOLAM 0.25 MG PO TABS: 0.2500 mg | ORAL_TABLET | Freq: Two times a day (BID) | ORAL | 2 refills | Status: AC | PRN

## 2024-01-27 NOTE — Progress Notes (Signed)
 [facility] [address] [place], [place] [address]  Internal MEDICINE  Office Visit Note  Patient Name: Jamie Sanchez  [id number]  [id number]  Date of Service: 01/27/2024  Chief Complaint  Patient presents with   Diabetes   Gastroesophageal Reflux   Hyperlipidemia   Hypertension    HPI Jamie Sanchez presents for a follow-up visit for lab orders, anxiety and medication refills  Stopped taking atorvastatin  information she has read about the medication being associated with dementia Anxiety-- due for refills, current dose remains effective. No issues.  Due for routine labs     Current Medication: Outpatient Encounter Medications as of 01/27/2024  Medication Sig   Accu-Chek FastClix Lancets MISC Use 1 lancet 3 times daily as needed to check glucose for diabetes E11.65   ALPRAZolam  (XANAX ) 0.25 MG tablet Take 1 tablet (0.25 mg total) by mouth 2 (two) times daily as needed for anxiety.   amLODipine  (NORVASC ) 5 MG tablet TAKE 1 TABLET(5 MG) BY MOUTH DAILY   atenolol  (TENORMIN ) 25 MG tablet TAKE 1 TABLET(25 MG) BY MOUTH DAILY   clotrimazole -betamethasone  (LOTRISONE ) cream Apply 1 Application topically daily. For rash until resolved   cyclobenzaprine  (FLEXERIL ) 10 MG tablet TAKE ONE TABLET (10 MG TOTAL) BY MOUTH AT BEDTIME AS NEEDED FOR MUSCLE SPASMS.   glucose blood (ACCU-CHEK GUIDE TEST) test strip Use 1 test strip 3 times daily as needed to check glucose for diabetes E11.65   omeprazole  (PRILOSEC) 40 MG capsule TAKE 1 CAPSULE(40 MG) BY MOUTH DAILY   Vitamin D , Ergocalciferol , (DRISDOL ) 1.25 MG (50000 UNIT) CAPS capsule Take 1 capsule (50,000 Units total) by mouth every 7 (seven) days.   [DISCONTINUED] ALPRAZolam  (XANAX ) 0.25 MG tablet Take 1 tablet (0.25 mg total) by mouth 2 (two) times daily as needed for anxiety.   [DISCONTINUED] atorvastatin  (LIPITOR) 10 MG tablet Take 1 tablet (10 mg total) by mouth daily.   No facility-administered encounter medications on file as of  01/27/2024.    Surgical History: Past Surgical History:  Procedure Laterality Date   ABDOMINAL HYSTERECTOMY     APPENDECTOMY     CATARACT EXTRACTION W/PHACO Left 09/24/2023   Procedure: PHACOEMULSIFICATION, CATARACT, WITH IOL INSERTION 5.67 00:43.1;  Surgeon: Enola Feliciano Hugger, MD;  Location: [facility];  Service: Ophthalmology;  Laterality: Left;   CATARACT EXTRACTION W/PHACO Right 10/08/2023   Procedure: PHACOEMULSIFICATION, CATARACT, WITH IOL INSERTION 5.18, 00:40.5;  Surgeon: Enola Feliciano Hugger, MD;  Location: [facility];  Service: Ophthalmology;  Laterality: Right;   INCONTINENCE SURGERY     KNEE ARTHROSCOPY Left     Medical History: Past Medical History:  Diagnosis Date   Acute bronchitis 08/18/2018   Acute pain of right hip 06/24/2017   Acute upper respiratory infection 02/24/2020   Anxiety    Arthritis    Colitis 09/30/2017   GERD (gastroesophageal reflux disease)    Hyperlipidemia    Hyperlipidemia associated with type 2 diabetes mellitus (HCC)    Hypertension    Type 2 diabetes mellitus with other specified complication, without long-term current use of insulin (HCC)    Vaginal candida 08/18/2018    Family History: Family History  Problem Relation Age of Onset   Heart attack Mother    Hypertension Father    Kidney cancer Father    Hypertension Sister    Heart attack Sister    Hypertension Brother    Heart attack Brother     Social History   Socioeconomic History   Marital status: Married  Spouse name: Not on file   Number of children: Not on file   Years of education: Not on file   Highest education level: Not on file  Occupational History   Not on file  Tobacco Use   Smoking status: Every Day    Current packs/day: 1.00    Average packs/day: 1 pack/day for 40.4 years (40.4 ttl pk-yrs)    Types: Cigarettes    Start date: 09/15/1983   Smokeless tobacco: Never  Vaping Use   Vaping status: Never Used  Substance and Sexual  Activity   Alcohol use: Yes    Comment: occ   Drug use: Never   Sexual activity: Not on file  Other Topics Concern   Not on file  Social History Narrative   Not on file   Social Drivers of Health   Financial Resource Strain: Not on file  Food Insecurity: Not on file  Transportation Needs: Not on file  Physical Activity: Not on file  Stress: Not on file  Social Connections: Not on file  Intimate Partner Violence: Not on file      Review of Systems  Constitutional:  Negative for chills, fatigue and unexpected weight change.  HENT:  Negative for congestion, rhinorrhea, sneezing and sore throat.   Eyes:  Negative for redness.  Respiratory: Negative.  Negative for cough, chest tightness, shortness of breath and wheezing.   Cardiovascular: Negative.  Negative for chest pain and palpitations.  Gastrointestinal:  Negative for abdominal pain, constipation, diarrhea, nausea and vomiting.  Genitourinary: Negative.   Musculoskeletal:  Negative for arthralgias, back pain, joint swelling and neck pain.  Skin:  Negative for rash.  Neurological: Negative.  Negative for tremors and numbness.  Hematological:  Negative for adenopathy. Does not bruise/bleed easily.  Psychiatric/Behavioral:  Negative for behavioral problems (Depression), sleep disturbance and suicidal ideas. The patient is not nervous/anxious.     Vital Signs: BP 132/88   Pulse 83   Temp (!) 97.5 F (36.4 C)   Resp 16   Ht 5' 5 (1.651 m)   Wt 193 lb 12.8 oz (87.9 kg)   SpO2 94%   BMI 32.25 kg/m    Physical Exam Vitals reviewed.  Constitutional:      Appearance: Normal appearance. She is obese.  HENT:     Head: Normocephalic and atraumatic.  Cardiovascular:     Rate and Rhythm: Normal rate and regular rhythm.     Pulses: Normal pulses.     Heart sounds: Normal heart sounds. No murmur heard. Pulmonary:     Effort: Pulmonary effort is normal. No respiratory distress.     Breath sounds: Normal breath sounds.   Skin:    General: Skin is warm and dry.     Capillary Refill: Capillary refill takes less than 2 seconds.  Neurological:     Mental Status: She is alert and oriented to person, place, and time.  Psychiatric:        Mood and Affect: Mood normal.        Behavior: Behavior normal.        Assessment/Plan: 1. Type 2 diabetes mellitus with other specified complication, without long-term current use of insulin (HCC) (Primary) Routine labs ordered. Glucose levels have been stable and well controlled at home.  - CBC with Differential/Platelet - CMP14+EGFR - Lipid Profile - Hgb A1C w/o eAG - Vitamin D  (25 hydroxy) - B12 and Folate Panel - TSH + free T4  2. Hyperlipidemia associated with type 2 diabetes mellitus (HCC) Routine  labs ordered - CBC with Differential/Platelet - CMP14+EGFR - Lipid Profile - Hgb A1C w/o eAG - Vitamin D  (25 hydroxy) - B12 and Folate Panel - TSH + free T4  3. B12 deficiency Routine labs ordered - CBC with Differential/Platelet - CMP14+EGFR - Lipid Profile - Hgb A1C w/o eAG - Vitamin D  (25 hydroxy) - B12 and Folate Panel - TSH + free T4  4. Vitamin D  deficiency Routine labs ordered - CBC with Differential/Platelet - CMP14+EGFR - Lipid Profile - Hgb A1C w/o eAG - Vitamin D  (25 hydroxy) - B12 and Folate Panel - TSH + free T4  5. Generalized anxiety disorder Continue alprazolam  as prescribed. Follow up in 3 months for additional refills. UDS due today  - ALPRAZolam  (XANAX ) 0.25 MG tablet; Take 1 tablet (0.25 mg total) by mouth 2 (two) times daily as needed for anxiety.  Dispense: 60 tablet; Refill: 2   General Counseling: dannelle rhymes understanding of the findings of todays visit and agrees with plan of treatment. I have discussed any further diagnostic evaluation that may be needed or ordered today. We also reviewed her medications today. she has been encouraged to call the office with any questions or concerns that should arise related to  todays visit.    Orders Placed This Encounter  Procedures   CBC with Differential/Platelet   CMP14+EGFR   Lipid Profile   Hgb A1C w/o eAG   Vitamin D  (25 hydroxy)   B12 and Folate Panel   TSH + free T4    Meds ordered this encounter  Medications   ALPRAZolam  (XANAX ) 0.25 MG tablet    Sig: Take 1 tablet (0.25 mg total) by mouth 2 (two) times daily as needed for anxiety.    Dispense:  60 tablet    Refill:  2    Refills ordered    Return in about 3 months (around 04/21/2024) for F/U, anxiety med refill, Orry Sigl PCP.   Total time spent:30 Minutes Time spent includes review of chart, medications, test results, and follow up plan with the patient.   [place] Controlled Substance Database was reviewed by me.  This patient was seen by Mardy Maxin, FNP-C in collaboration with Dr. Sigrid Bathe as a part of collaborative care agreement.   Classie Weng R. Maxin, MSN, FNP-C Internal medicine

## 2024-01-29 LAB — CBC WITH DIFFERENTIAL/PLATELET
Basophils Absolute: 0.1 x10E3/uL (ref 0.0–0.2)
Basos: 1 %
EOS (ABSOLUTE): 0.1 x10E3/uL (ref 0.0–0.4)
Eos: 2 %
Hematocrit: 49.2 % — ABNORMAL HIGH (ref 34.0–46.6)
Hemoglobin: 16 g/dL — ABNORMAL HIGH (ref 11.1–15.9)
Immature Grans (Abs): 0 x10E3/uL (ref 0.0–0.1)
Immature Granulocytes: 0 %
Lymphocytes Absolute: 3.1 x10E3/uL (ref 0.7–3.1)
Lymphs: 38 %
MCH: 32.9 pg (ref 26.6–33.0)
MCHC: 32.5 g/dL (ref 31.5–35.7)
MCV: 101 fL — ABNORMAL HIGH (ref 79–97)
Monocytes Absolute: 0.6 x10E3/uL (ref 0.1–0.9)
Monocytes: 8 %
Neutrophils Absolute: 4.1 x10E3/uL (ref 1.4–7.0)
Neutrophils: 51 %
Platelets: 340 x10E3/uL (ref 150–450)
RBC: 4.86 x10E6/uL (ref 3.77–5.28)
RDW: 12.5 % (ref 11.7–15.4)
WBC: 8.1 x10E3/uL (ref 3.4–10.8)

## 2024-01-29 LAB — CMP14+EGFR
ALT: 18 IU/L (ref 0–32)
AST: 20 IU/L (ref 0–40)
Albumin: 4.4 g/dL (ref 3.8–4.9)
Alkaline Phosphatase: 75 IU/L (ref 49–135)
BUN/Creatinine Ratio: 9 (ref 9–23)
BUN: 8 mg/dL (ref 6–24)
Bilirubin Total: 0.6 mg/dL (ref 0.0–1.2)
CO2: 23 mmol/L (ref 20–29)
Calcium: 9.6 mg/dL (ref 8.7–10.2)
Chloride: 101 mmol/L (ref 96–106)
Creatinine, Ser: 0.94 mg/dL (ref 0.57–1.00)
Globulin, Total: 2.5 g/dL (ref 1.5–4.5)
Glucose: 110 mg/dL — ABNORMAL HIGH (ref 70–99)
Potassium: 4.7 mmol/L (ref 3.5–5.2)
Sodium: 139 mmol/L (ref 134–144)
Total Protein: 6.9 g/dL (ref 6.0–8.5)
eGFR: 72 mL/min/1.73 (ref >59)

## 2024-01-29 LAB — LIPID PANEL
Chol/HDL Ratio: 6.2 ratio — ABNORMAL HIGH (ref 0.0–4.4)
Cholesterol, Total: 241 mg/dL — ABNORMAL HIGH (ref 100–199)
HDL: 39 mg/dL — ABNORMAL LOW (ref >39)
LDL Chol Calc (NIH): 165 mg/dL — ABNORMAL HIGH (ref 0–99)
Triglycerides: 201 mg/dL — ABNORMAL HIGH (ref 0–149)
VLDL Cholesterol Cal: 37 mg/dL (ref 5–40)

## 2024-01-29 LAB — HGB A1C W/O EAG: Hgb A1c MFr Bld: 6.1 % — ABNORMAL HIGH (ref 4.8–5.6)

## 2024-01-29 LAB — TSH+FREE T4
Free T4: 0.91 ng/dL (ref 0.82–1.77)
TSH: 3.68 u[IU]/mL (ref 0.450–4.500)

## 2024-01-29 LAB — B12 AND FOLATE PANEL
Folate: 3.6 ng/mL (ref >3.0)
Vitamin B-12: 490 pg/mL (ref 232–1245)

## 2024-01-29 LAB — VITAMIN D 25 HYDROXY (VIT D DEFICIENCY, FRACTURES): Vit D, 25-Hydroxy: 37 ng/mL (ref 30.0–100.0)

## 2024-02-12 ENCOUNTER — Other Ambulatory Visit: Payer: Self-pay | Admitting: Nurse Practitioner

## 2024-02-12 DIAGNOSIS — M1990 Unspecified osteoarthritis, unspecified site: Secondary | ICD-10-CM

## 2024-04-20 ENCOUNTER — Ambulatory Visit: Admitting: Nurse Practitioner

## 2024-06-10 ENCOUNTER — Encounter: Admitting: Nurse Practitioner
